# Patient Record
Sex: Female | Born: 1965 | Race: White | Hispanic: No | Marital: Married | Smoking: Never smoker
Health system: Southern US, Community
[De-identification: ages and names within clinical notes are randomized; demographics above are authoritative.]

## PROBLEM LIST (undated history)

## (undated) HISTORY — PX: LIPOMA EXCISION: SHX5283

---

## 1997-10-01 ENCOUNTER — Inpatient Hospital Stay (HOSPITAL_COMMUNITY): Admission: AD | Admit: 1997-10-01 | Discharge: 1997-10-03 | Payer: Self-pay | Admitting: *Deleted

## 1998-07-16 ENCOUNTER — Encounter: Admission: RE | Admit: 1998-07-16 | Discharge: 1998-10-14 | Payer: Self-pay | Admitting: Internal Medicine

## 1998-11-17 ENCOUNTER — Other Ambulatory Visit: Admission: RE | Admit: 1998-11-17 | Discharge: 1998-11-17 | Payer: Self-pay | Admitting: *Deleted

## 1999-08-06 ENCOUNTER — Inpatient Hospital Stay (HOSPITAL_COMMUNITY): Admission: AD | Admit: 1999-08-06 | Discharge: 1999-08-06 | Payer: Self-pay | Admitting: *Deleted

## 1999-08-06 ENCOUNTER — Inpatient Hospital Stay (HOSPITAL_COMMUNITY): Admission: AD | Admit: 1999-08-06 | Discharge: 1999-08-09 | Payer: Self-pay | Admitting: Obstetrics and Gynecology

## 1999-09-13 ENCOUNTER — Other Ambulatory Visit: Admission: RE | Admit: 1999-09-13 | Discharge: 1999-09-13 | Payer: Self-pay | Admitting: *Deleted

## 2001-02-05 ENCOUNTER — Other Ambulatory Visit: Admission: RE | Admit: 2001-02-05 | Discharge: 2001-02-05 | Payer: Self-pay | Admitting: Obstetrics and Gynecology

## 2002-08-28 ENCOUNTER — Encounter: Admission: RE | Admit: 2002-08-28 | Discharge: 2002-08-28 | Payer: Self-pay | Admitting: Neurosurgery

## 2002-08-28 ENCOUNTER — Encounter: Payer: Self-pay | Admitting: Neurosurgery

## 2002-09-11 ENCOUNTER — Encounter: Payer: Self-pay | Admitting: Internal Medicine

## 2002-09-11 ENCOUNTER — Encounter: Admission: RE | Admit: 2002-09-11 | Discharge: 2002-09-11 | Payer: Self-pay | Admitting: Internal Medicine

## 2006-10-08 ENCOUNTER — Encounter: Admission: RE | Admit: 2006-10-08 | Discharge: 2006-10-08 | Payer: Self-pay | Admitting: Internal Medicine

## 2006-10-09 ENCOUNTER — Ambulatory Visit: Payer: Self-pay | Admitting: Cardiology

## 2006-10-12 ENCOUNTER — Ambulatory Visit: Payer: Self-pay | Admitting: Cardiovascular Disease

## 2006-10-12 ENCOUNTER — Ambulatory Visit: Payer: Self-pay

## 2006-10-31 ENCOUNTER — Ambulatory Visit: Payer: Self-pay | Admitting: Cardiology

## 2009-02-05 ENCOUNTER — Ambulatory Visit: Payer: Self-pay | Admitting: Internal Medicine

## 2009-03-02 ENCOUNTER — Ambulatory Visit: Payer: Self-pay | Admitting: Internal Medicine

## 2009-03-22 ENCOUNTER — Other Ambulatory Visit: Admission: RE | Admit: 2009-03-22 | Discharge: 2009-03-22 | Payer: Self-pay | Admitting: Internal Medicine

## 2009-03-22 ENCOUNTER — Ambulatory Visit: Payer: Self-pay | Admitting: Internal Medicine

## 2010-09-06 NOTE — Assessment & Plan Note (Signed)
[facility]                            CARDIOLOGY OFFICE NOTE   [name], [name] MYCALA WARSHAWSKY                     MRN:          [id number]  DATE:10/09/2006                            DOB:          [birth year]    REFERRING PHYSICIAN:  Luanna Cole. Lenord Fellers, M.D.   REASON FOR CONSULTATION:  Chest and back pain.   HISTORY OF PRESENT ILLNESS:  Ms. Grothaus is a pleasant 45 year old woman  with recently-documented LDL cholesterol of 129 and a family history of  cardiovascular disease, including her mother who had a myocardial  infarction in her early 10's.  She reports no personal history of  cardiovascular disease, hypertension or diabetes.  She has no history of  tobacco use.  She has been in her usual state of health; however, over  the last few weeks she has experienced some symptoms initially of a  dull brief (lasting for only a few seconds), pain across her upper  chest.  She states that this seemed to be worse when she would lean  over, and was not particularly exertional in nature.  More recently in  the last few days, she has experienced more of a pressure-like sensation  and also a mid to lower back pain.  This has occurred intermittently and  not with exertion, but has been associated with a general feeling of  numbness in her arms and also some discomfort in her neck.  She states  that this morning her symptoms were worse when she was bending over  doing laundry.  She has over the last week been walking two to three  miles at a time with a friend, and is not experiencing any symptoms  while walking.  She has had no prior cardiac risk stratification  although did have a resting electrocardiogram obtained yesterday by Dr.  Lenord Fellers which was essentially normal.  She also had a standard chest x-  ray which showed a normal heart size.  No pleural fluid and clear lung  fields.  HDL was 64, LDL 129, potassium 4.4, BUN 12, creatinine 0.8.  Her hemoglobin was normal at  12.9.  She was concerned somewhat about her  heart, given her mother's medical problems and also her older sister at  age 4, who is having difficulty with elevated blood pressure and  cholesterol.  We are asked to assess her further.   ALLERGIES:  No known drug allergies.   CURRENT MEDICATIONS:  None chronically.  She does use ibuprofen and  aspirin p.r.n.   PAST MEDICAL HISTORY:  As outlined above.   PAST SURGICAL HISTORY:  1. 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  2. A right breast fibroid removed.  3. A lipoma removed from her shoulder between 1990, and 1991.   SOCIAL HISTORY:  The patient is married.  She has four children.  She is  a stay-at-home mom.  There is no tobacco use.  Denies any alcohol use.  Drinks one or two caffeinated beverages daily.   REVIEW OF SYSTEMS:  As in the history of present  illness.  She does have  some seasonal allergies.  Occasionally she feels fatigued and does not  sleep well.  She has had some trouble with anxiety and depression in the  past.  She does have some history of menstrual dysfunction and pain when  she ovulates.   PHYSICAL EXAMINATION:  VITAL SIGNS:  Blood pressure today 110/78, heart  rate 70 and regular, weight 157 pounds.  GENERAL:  A normally-nourished-appearing woman, in no acute distress.  HEENT:  Conjunctivae normal.  Pharynx clear.  NECK:  Supple, no elevated jugular venous pressure or carotid bruits.  No thyromegaly or thyroid tenderness.  LUNGS:  Clear.  Normal expansion.  No limited breathing at rest.  CARDIAC:  A regular rate and rhythm.  No S3 gallop or pericardial rub.  No loud systolic murmur.  ABDOMEN:  Without obvious hepatomegaly.  No bruits.  Bowel sounds are  present.  EXTREMITIES:  No pitting edema.  SKIN:  Warm and dry.  MUSCULOSKELETAL:  No kyphosis.  NEUROLOGIC/PSYCHIATRIC:  Alert and oriented x3.  Affect is normal.   IMPRESSION/RECOMMENDATIONS:  Recent symptoms of  chest pain and back  pain, fairly atypical in description from the perspective of ischemia.  She has had some associated arm numbness and neck pain as well, however.  Her resting electrocardiogram is normal and recent plain view of the  chest was normal.  She is not hypertensive.  She does have a family  history of cardiovascular disease and a mildly elevated low-density  lipoprotein of 129.  She remains concerned about her cardiac status.   I discussed her symptoms today and we have planned a diagnostic approach  including a contrasted CT scan of the chest, to exclude the possibility  of aortic disease or thromboembolic disease (doubt), and assuming this  is reassuring, general cardiovascular screening with an exercise  echocardiogram.  If these studies are reassuring, it may in fact be that  she is experiencing musculoskeletal discomfort, perhaps related to her  back or disk disease.  We will inform her of the test results by phone,  and I will plan a follow-up visit in the office to review these as well.  Further plans to follow.     Jonelle Sidle, MD  Electronically Signed    SGM/MedQ  DD: 10/09/2006  DT: 10/09/2006  Job #: [id number]   cc:   Luanna Cole. Lenord Fellers, M.D.

## 2010-09-06 NOTE — Assessment & Plan Note (Signed)
[facility]                            CARDIOLOGY OFFICE NOTE   NAME:[name]                     MRN:          [id number]  DATE:10/31/2006                            DOB:          [birth year]    PRIMARY CARE PHYSICIAN:  Luanna Cole. Lenord Fellers, M.D.   REASON FOR VISIT:  Follow up testing.   HISTORY OF PRESENT ILLNESS:  I saw [name] back in June.  Her history  is detailed in my previous note.  I referred her for further assessment  beginning with a CT scan of the chest which revealed no evidence of  aortic aneurysm dissection or pulmonary embolus.  Heart size and  pulmonary arterial size were described at the upper limit of normal.  Although I question whether this is of any significance clinically at  this point.  She had a exercise Cardiolite which demonstrated no  evidence of ischemia or scar and there were no abnormal  electrocardiographic changes.  I reviewed these results with the patient  today and she was reassured.  We talked about basic risk factor  modification.  She did have lipids obtained recently showing an LDL of  129 and an HDL of 64.  It is not entirely clear that she needs therapy  at this point.  Optimally an LDL under 100 would be ideal and she should  continue to work on diet and exercise.  She had some questions today  about inflammation as it relates to coronary artery disease and  atherosclerosis.  We discussed this.   ALLERGIES:  No known drug allergies.   PRESENT MEDICATIONS:  Aspirin or ibuprofen p.r.n.   EXAMINATION:  Blood pressure is 102/58, heart rate 70, weight is 160  pounds.  The patient is comfortable and in no acute distress.  No significant  change in examination as documented in the previous note.   IMPRESSION/RECOMMENDATIONS:  1. History of fairly atypical chest and back discomfort, likely      musculoskeletal.  Recent testing including CT scan of the chest and      exercise Myoview were reassuring.  I  would anticipate basic risk      factor modification strategies. Her lipids were recently outlined      and are overall fairly reassuring, although optimally an LDL under      100 would be ideal.  One could consider at some point checking a      high sensitivity C-reactive protein and if this were significantly      elevated this might lend more evidence to aggressively managing her      lipids.  Otherwise if it were in normal range, basic measures would      be reasonable.  I do not anticipate any      additional cardiac testing at this time and we can see her back as      needed.  2. Continue followup with Dr. Lenord Fellers.     Jonelle Sidle, MD  Electronically Signed    SGM/MedQ  DD: 10/31/2006  DT: 10/31/2006  Job #: [id number]   cc:  Luanna Cole. Lenord Fellers, M.D.

## 2011-02-13 ENCOUNTER — Encounter: Payer: Self-pay | Admitting: Internal Medicine

## 2011-02-14 ENCOUNTER — Encounter: Payer: Self-pay | Admitting: Internal Medicine

## 2011-02-14 ENCOUNTER — Ambulatory Visit (INDEPENDENT_AMBULATORY_CARE_PROVIDER_SITE_OTHER): Payer: PRIVATE HEALTH INSURANCE | Admitting: Internal Medicine

## 2011-02-14 VITALS — BP 116/80 | HR 72 | Temp 99.0°F | Ht 61.25 in | Wt 166.0 lb

## 2011-02-14 DIAGNOSIS — R5381 Other malaise: Secondary | ICD-10-CM

## 2011-02-14 DIAGNOSIS — H6591 Unspecified nonsuppurative otitis media, right ear: Secondary | ICD-10-CM

## 2011-02-14 DIAGNOSIS — J309 Allergic rhinitis, unspecified: Secondary | ICD-10-CM

## 2011-02-14 DIAGNOSIS — H6590 Unspecified nonsuppurative otitis media, unspecified ear: Secondary | ICD-10-CM

## 2011-02-14 DIAGNOSIS — K219 Gastro-esophageal reflux disease without esophagitis: Secondary | ICD-10-CM

## 2011-02-14 DIAGNOSIS — R5383 Other fatigue: Secondary | ICD-10-CM

## 2011-02-14 NOTE — Patient Instructions (Signed)
Take Zithromax Z-PAK as prescribed and Sterapred 6 day dosepak as prescribed. Symptoms should improve within one week. If symptoms do not improve, suggest allergy evaluation. Would restart omeprazole 40 mg at 5 PM as recommended by ENT physician

## 2011-02-14 NOTE — Progress Notes (Signed)
  Subjective:    Patient ID: [name], female    DOB: [birth year], 45 y.o.   MRN: [id number]  HPI 45 year old white female not seen since November 2010. At that time she was complaining of some trouble with hoarseness and globus sensation along with chronic right-sided otalgia. In December 2010 she was seen by Dr. Annalee Genta who found her ENT exam to be normal with the exception of evidence of reflux and placed her on omeprazole 40 mg daily at 5 PM. She had no evidence of hearing loss. Was thought to have possible TMJ dysfunction. Reflux per cautions identified to her by ENT physician. Today  is complaining of right ear pain. Patient sounds nasally congested. Not taking reflux medication at this time. Also complaining of fatigue.     Review of Systems     Objective:   Physical Exam right TM shows fullness but not redness. Left TM is only slightly full. Very boggy nasal mucosa bilaterally. Pharynx is clear without exudate. Neck is supple. Complaining of some neck tenderness but no significant enlargement of lymph nodes in neck and chest clear to auscultation. No TM  joint tenderness. No thyromegaly        Assessment & Plan:  Right serous otitis media  Probable allergic rhinitis  Fatigue  Plan: Patient treated today with Zithromax Z-PAK take 2 tablets by mouth day one followed by 1 tablet days 2 through 5. Given Sterapred DS 10 mg 6 day dosepak to take as directed in tapering course for suspected allergy symptoms, serous otitis media, boggy nasal mucosa. Patient should consider restarting omeprazole 40 mg at 5 PM as previously prescribed by ENT physician. Likely continues to have reflux. If symptoms do not improve after this course of treatment, suggest allergy evaluation. With regard to fatigue, TSH is checked today

## 2012-05-09 ENCOUNTER — Encounter: Payer: Self-pay | Admitting: Internal Medicine

## 2012-05-09 ENCOUNTER — Other Ambulatory Visit: Payer: Self-pay | Admitting: Internal Medicine

## 2012-05-09 ENCOUNTER — Ambulatory Visit (INDEPENDENT_AMBULATORY_CARE_PROVIDER_SITE_OTHER): Payer: PRIVATE HEALTH INSURANCE | Admitting: Internal Medicine

## 2012-05-09 VITALS — BP 126/84 | HR 76 | Temp 98.3°F | Ht 61.5 in | Wt 169.0 lb

## 2012-05-09 DIAGNOSIS — E785 Hyperlipidemia, unspecified: Secondary | ICD-10-CM

## 2012-05-09 DIAGNOSIS — F411 Generalized anxiety disorder: Secondary | ICD-10-CM

## 2012-05-09 DIAGNOSIS — E669 Obesity, unspecified: Secondary | ICD-10-CM

## 2012-05-09 DIAGNOSIS — M549 Dorsalgia, unspecified: Secondary | ICD-10-CM

## 2012-05-09 DIAGNOSIS — F419 Anxiety disorder, unspecified: Secondary | ICD-10-CM

## 2012-05-09 DIAGNOSIS — R109 Unspecified abdominal pain: Secondary | ICD-10-CM

## 2012-05-09 DIAGNOSIS — Z23 Encounter for immunization: Secondary | ICD-10-CM

## 2012-05-09 LAB — POCT URINALYSIS DIPSTICK
Bilirubin, UA: NEGATIVE
Ketones, UA: NEGATIVE
Leukocytes, UA: NEGATIVE
Spec Grav, UA: 1.01
pH, UA: 8

## 2012-05-09 LAB — COMPREHENSIVE METABOLIC PANEL
Albumin: 4.2 g/dL (ref 3.5–5.2)
BUN: 10 mg/dL (ref 6–23)
CO2: 27 mEq/L (ref 19–32)
Calcium: 9 mg/dL (ref 8.4–10.5)
Chloride: 105 mEq/L (ref 96–112)
Glucose, Bld: 84 mg/dL (ref 70–99)
Potassium: 4.2 mEq/L (ref 3.5–5.3)

## 2012-05-09 LAB — CBC WITH DIFFERENTIAL/PLATELET
HCT: 39.1 % (ref 36.0–46.0)
Hemoglobin: 13.1 g/dL (ref 12.0–15.0)
Lymphocytes Relative: 28 % (ref 12–46)
MCV: 88.9 fL (ref 78.0–100.0)
Monocytes Absolute: 0.3 10*3/uL (ref 0.1–1.0)
Monocytes Relative: 5 % (ref 3–12)
Neutro Abs: 3.7 10*3/uL (ref 1.7–7.7)
WBC: 5.7 10*3/uL (ref 4.0–10.5)

## 2012-05-09 LAB — LIPID PANEL: Cholesterol: 212 mg/dL — ABNORMAL HIGH (ref 0–200)

## 2012-05-09 LAB — AMYLASE: Amylase: 36 U/L (ref 0–105)

## 2012-05-09 LAB — TSH: TSH: 1.691 u[IU]/mL (ref 0.350–4.500)

## 2012-05-09 MED ORDER — TETANUS-DIPHTH-ACELL PERTUSSIS 5-2.5-18.5 LF-MCG/0.5 IM SUSP: 0.5000 mL | Freq: Once | INTRAMUSCULAR | Status: DC

## 2012-05-09 NOTE — Patient Instructions (Addendum)
We will arrange for CT of abdomen and pelvis. Fasting labs have been drawn and results are pending. We'll contact you with results. Please see GYN physician for GYN appointment and further discussion regarding abdominal pain and issues with menses and ovulation. Please have mammogram. Order has been written and given to you today.
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
## 2012-05-10 ENCOUNTER — Encounter: Payer: Self-pay | Admitting: Internal Medicine

## 2012-05-10 DIAGNOSIS — E785 Hyperlipidemia, unspecified: Secondary | ICD-10-CM | POA: Insufficient documentation

## 2012-05-10 DIAGNOSIS — E669 Obesity, unspecified: Secondary | ICD-10-CM | POA: Insufficient documentation

## 2012-05-10 LAB — H. PYLORI ANTIBODY, IGG: H Pylori IgG: 0.44 {ISR}

## 2012-05-14 ENCOUNTER — Ambulatory Visit
Admission: RE | Admit: 2012-05-14 | Discharge: 2012-05-14 | Disposition: A | Discharge status: Home / Self Care | Payer: PRIVATE HEALTH INSURANCE | Source: Ambulatory Visit | Attending: Internal Medicine | Admitting: Internal Medicine

## 2012-05-14 MED ORDER — IOHEXOL 300 MG/ML  SOLN
100.0000 mL | Freq: Once | INTRAMUSCULAR | Status: AC | PRN
  Administered 2012-05-14: 100 mL via INTRAVENOUS

## 2012-05-15 ENCOUNTER — Telehealth: Payer: Self-pay

## 2012-05-15 DIAGNOSIS — R109 Unspecified abdominal pain: Secondary | ICD-10-CM

## 2012-05-15 NOTE — Progress Notes (Signed)
Patient informed. Scheduled for abd u/s

## 2012-05-15 NOTE — Telephone Encounter (Signed)
Patient scheduled for abd ultrasound on 05/17/2012 at 9:30 at [address]. Informed of this appointment

## 2012-05-17 ENCOUNTER — Other Ambulatory Visit: Payer: Self-pay | Admitting: Internal Medicine

## 2012-05-17 ENCOUNTER — Ambulatory Visit
Admission: RE | Admit: 2012-05-17 | Discharge: 2012-05-17 | Disposition: A | Discharge status: Home / Self Care | Payer: PRIVATE HEALTH INSURANCE | Source: Ambulatory Visit | Attending: Internal Medicine | Admitting: Internal Medicine

## 2012-05-17 ENCOUNTER — Ambulatory Visit: Payer: PRIVATE HEALTH INSURANCE

## 2012-05-17 DIAGNOSIS — R109 Unspecified abdominal pain: Secondary | ICD-10-CM

## 2012-05-20 NOTE — Progress Notes (Signed)
Patient informed. 

## 2014-02-10 ENCOUNTER — Encounter: Payer: Self-pay | Admitting: Internal Medicine

## 2014-03-11 IMAGING — CT CT ABD-PELV W/ CM
2 of 5 series · 17 of 46 positions shown, 19 images · IV contrast (READICAT/WATER & [ID] OMNI 300)
Comparison: None.

CLINICAL DATA: Right upper quadrant and upper mid abdomen pain for
3 months

CT ABDOMEN AND PELVIS WITH CONTRAST
TECHNIQUE: Multidetector CT imaging of the abdomen and pelvis was
performed following the standard protocol during bolus
administration of intravenous contrast.
Contrast: 100mL OMNIPAQUE IOHEXOL 300 MG/ML  SOLN

[Series 2: abd/pelvis with · axial · 0.70mm/px · z∈[-295,+85]mm · 14 of 83 slices shown, 16 images]
[im 5/83  soft-tissue]
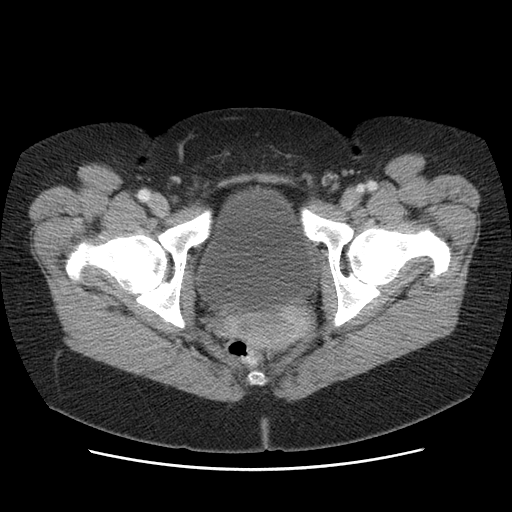
[im 5/83  bone]
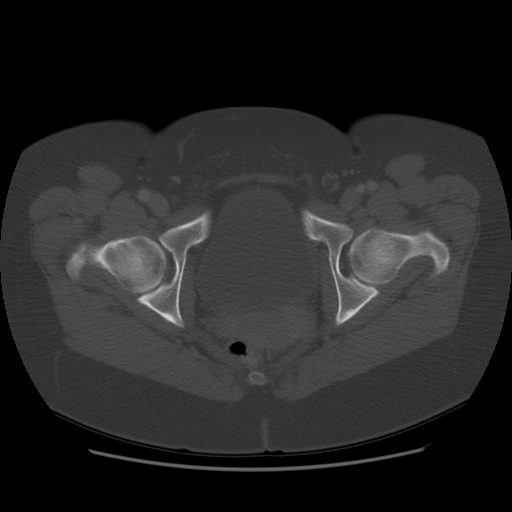
[im 13/83  soft-tissue]
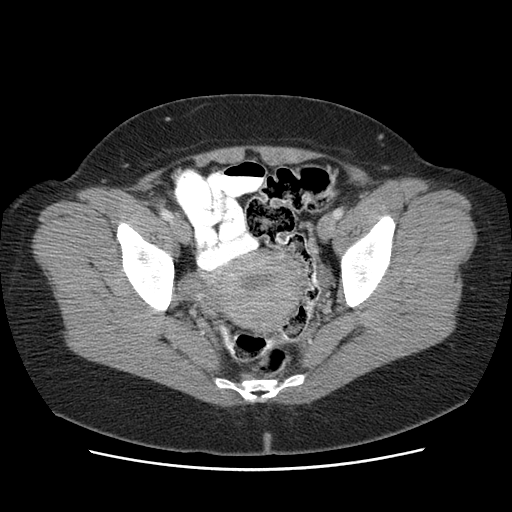
[im 17/83  soft-tissue]
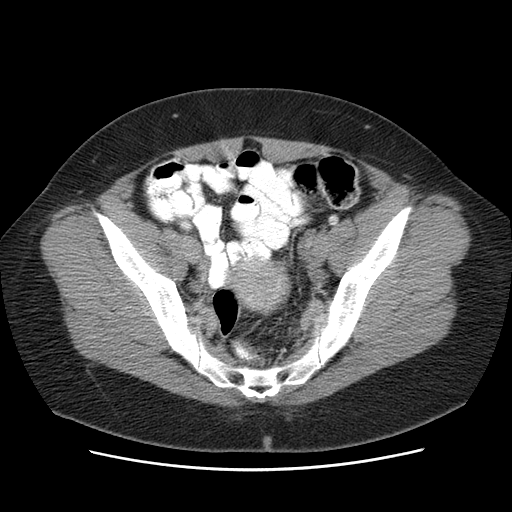
[im 21/83  soft-tissue]
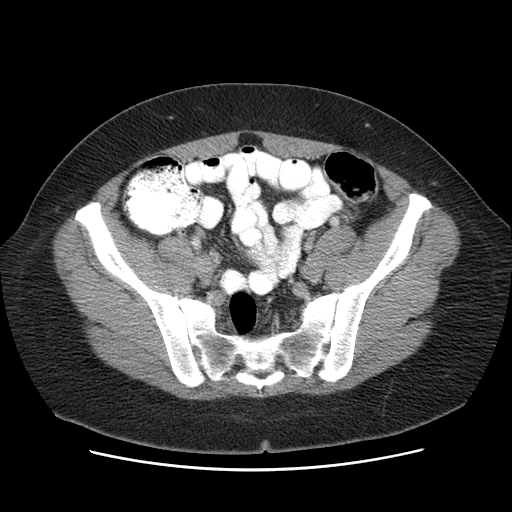
[im 29/83  soft-tissue]
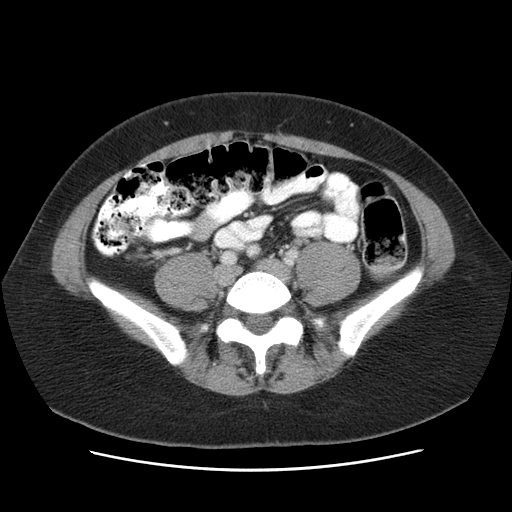
[im 33/83  soft-tissue]
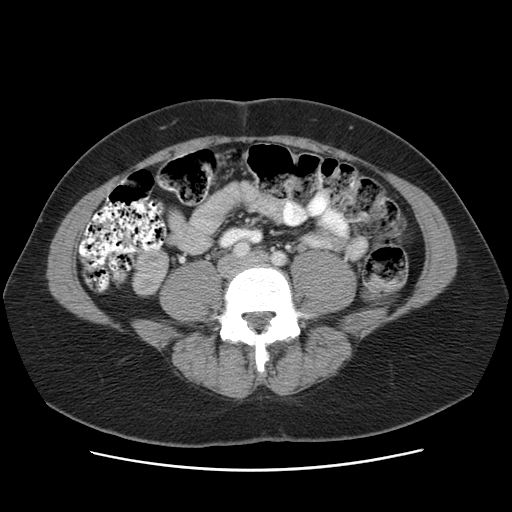
[im 37/83  soft-tissue]
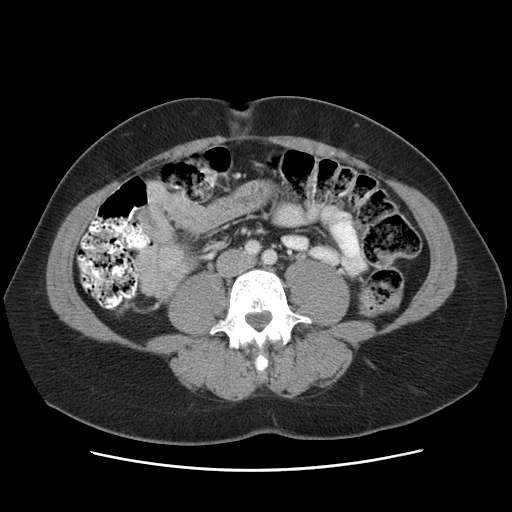
[im 46/83  soft-tissue]
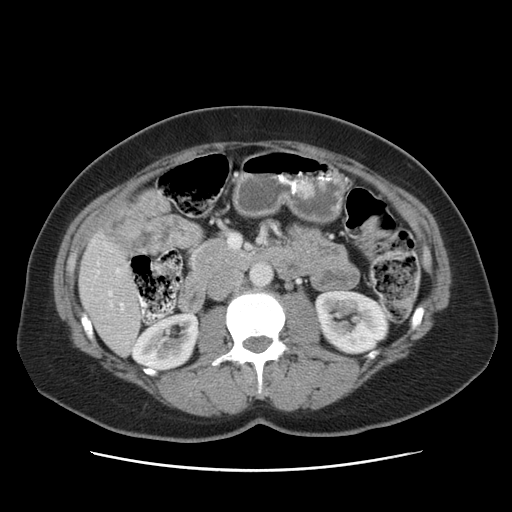
[im 50/83  soft-tissue]
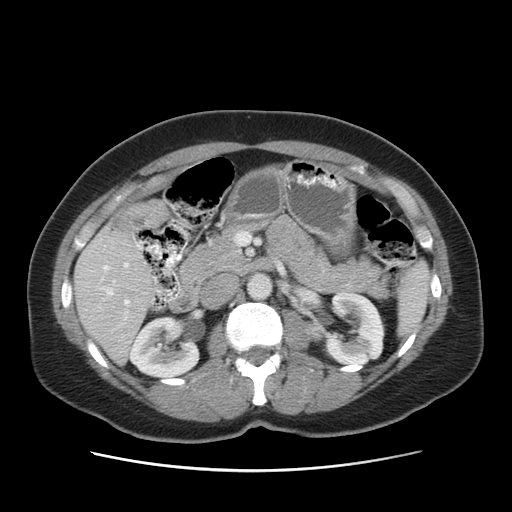
[im 50/83  bone]
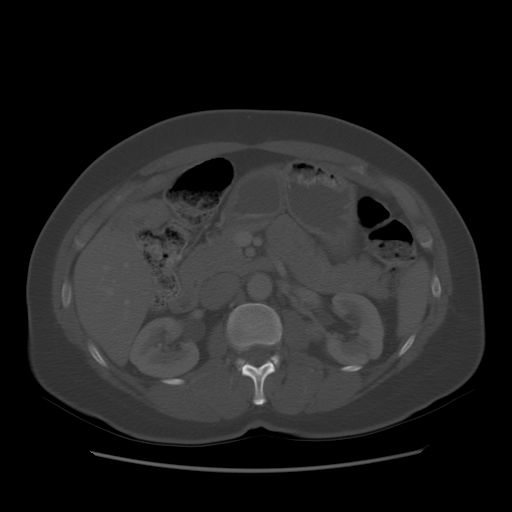
[im 54/83  soft-tissue]
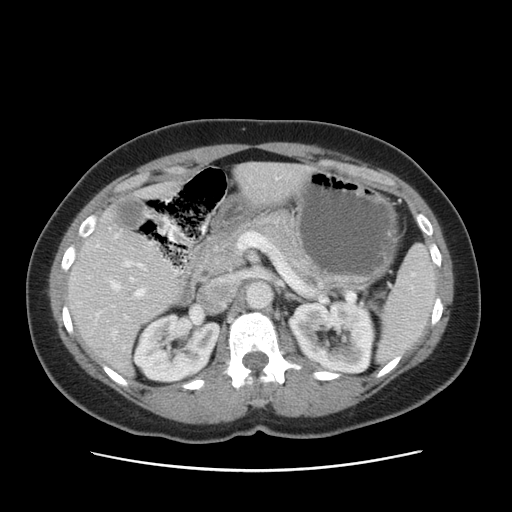
[im 62/83  soft-tissue]
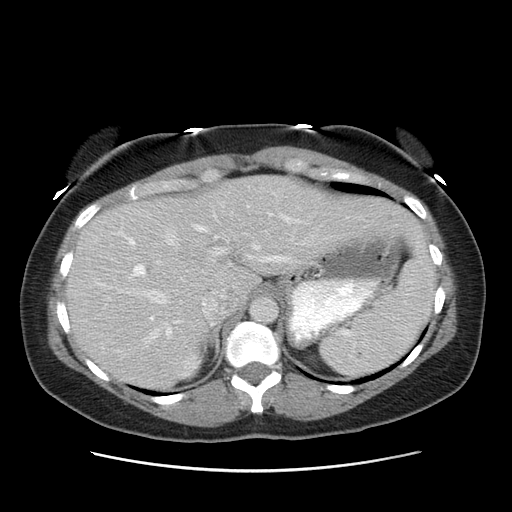
[im 66/83  soft-tissue]
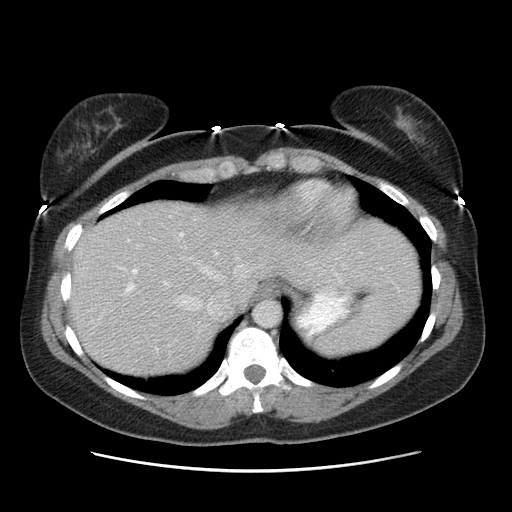
[im 70/83  soft-tissue]
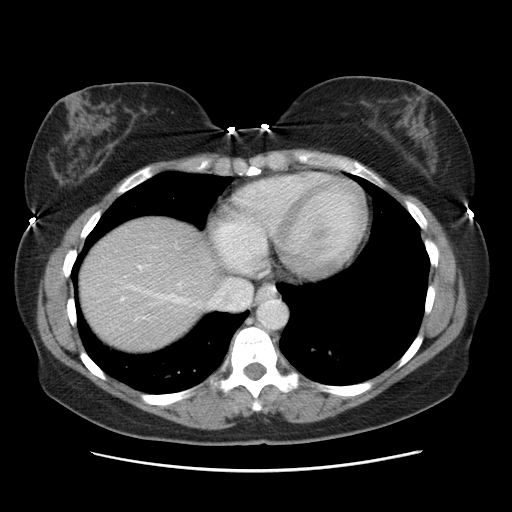
[im 78/83  soft-tissue]
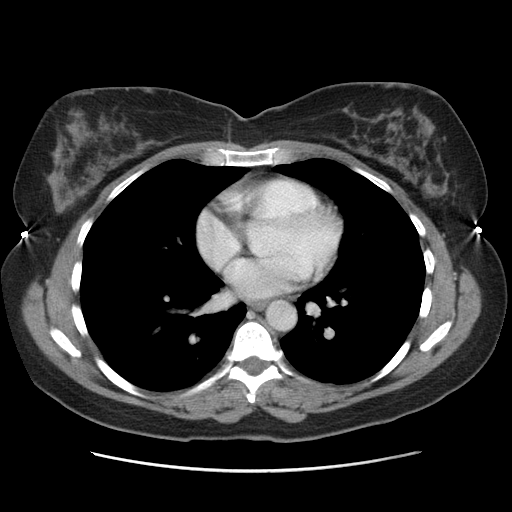

[Series 400: coronal · coronal · 0.93mm/px · 3 of 107 slices shown]
[im 36/107  soft-tissue]
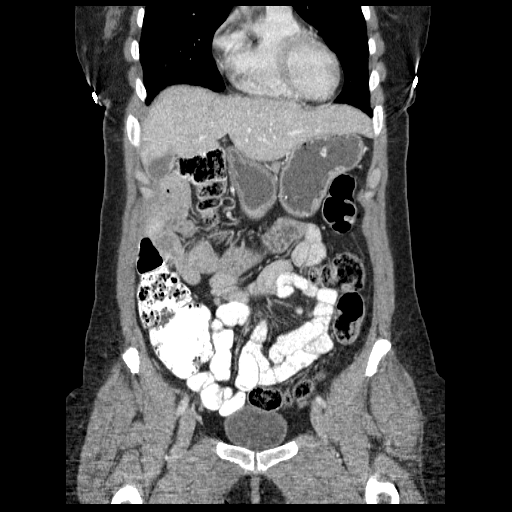
[im 48/107  soft-tissue]
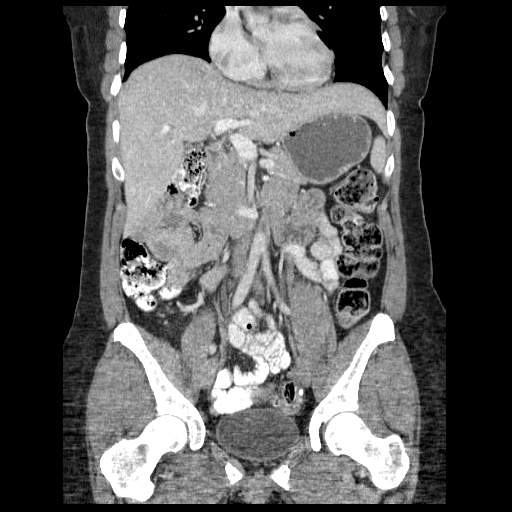
[im 59/107  soft-tissue]
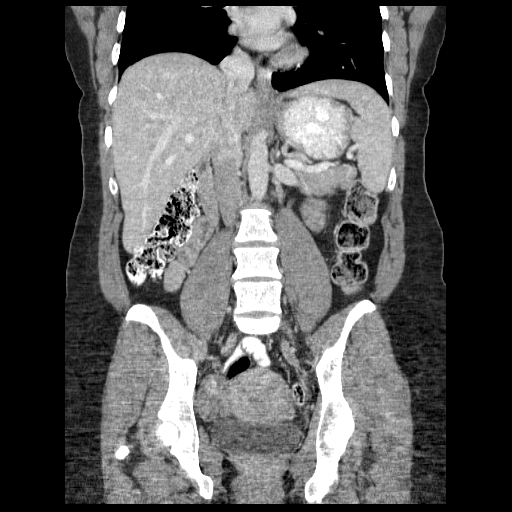

[17 of 46 positions shown; findings below may reference images not displayed]

FINDINGS: The lung bases are clear.  The liver enhances with no
focal abnormality and no ductal dilatation is seen.  No calcified
gallstones are noted within the somewhat contracted gallbladder.
The pancreas is normal in size and the pancreatic duct is not
dilated.  The adrenal glands and spleen are unremarkable.  The
stomach is moderately fluid distended with no abnormality noted.
The kidneys enhance and there is a single 2-3 mm nonobstructing
left upper pole renal calculus present.  No hydronephrosis is seen.
The abdominal aorta is normal in caliber.  No adenopathy is noted.

The uterus is normal in size and no adnexal lesion is seen.  No
free fluid is seen within the pelvis.  The urinary bladder is
moderately distended with no abnormality noted.  No abnormality of
the colon is seen.  The terminal ileum is unremarkable.  The
appendix coils in the right lower quadrant with no abnormality
noted.  No skeletal abnormality is seen.
IMPRESSION: 1.  No explanation for the patient's pain is seen.  Ultrasound of
the right upper quadrant may be help to exclude gallstones.
2.  Single 2-3 mm nonobstructing left upper pole renal calculus.

## 2015-04-08 ENCOUNTER — Emergency Department (HOSPITAL_COMMUNITY): Payer: Managed Care, Other (non HMO)

## 2015-04-08 ENCOUNTER — Emergency Department (HOSPITAL_COMMUNITY)
Admission: EM | Admit: 2015-04-08 | Discharge: 2015-04-08 | Disposition: A | Discharge status: Home / Self Care | Payer: Managed Care, Other (non HMO) | Attending: Emergency Medicine | Admitting: Emergency Medicine

## 2015-04-08 ENCOUNTER — Encounter (HOSPITAL_COMMUNITY): Payer: Self-pay | Admitting: *Deleted

## 2015-04-08 ENCOUNTER — Encounter: Payer: Self-pay | Admitting: Internal Medicine

## 2015-04-08 DIAGNOSIS — Z7982 Long term (current) use of aspirin: Secondary | ICD-10-CM | POA: Diagnosis not present

## 2015-04-08 DIAGNOSIS — R110 Nausea: Secondary | ICD-10-CM | POA: Insufficient documentation

## 2015-04-08 DIAGNOSIS — M549 Dorsalgia, unspecified: Secondary | ICD-10-CM | POA: Diagnosis not present

## 2015-04-08 DIAGNOSIS — R109 Unspecified abdominal pain: Secondary | ICD-10-CM | POA: Diagnosis present

## 2015-04-08 DIAGNOSIS — Z8679 Personal history of other diseases of the circulatory system: Secondary | ICD-10-CM | POA: Insufficient documentation

## 2015-04-08 DIAGNOSIS — R1011 Right upper quadrant pain: Secondary | ICD-10-CM | POA: Diagnosis not present

## 2015-04-08 DIAGNOSIS — Z79899 Other long term (current) drug therapy: Secondary | ICD-10-CM | POA: Diagnosis not present

## 2015-04-08 LAB — CBC WITH DIFFERENTIAL/PLATELET
BASOS ABS: 0 10*3/uL (ref 0.0–0.1)
Basophils Relative: 0 %
EOS PCT: 2 %
Eosinophils Absolute: 0.2 10*3/uL (ref 0.0–0.7)
HCT: 39.9 % (ref 36.0–46.0)
Hemoglobin: 13.4 g/dL (ref 12.0–15.0)
LYMPHS PCT: 15 %
Lymphs Abs: 1.5 10*3/uL (ref 0.7–4.0)
MCH: 30.3 pg (ref 26.0–34.0)
MCHC: 33.6 g/dL (ref 30.0–36.0)
MCV: 90.3 fL (ref 78.0–100.0)
Monocytes Absolute: 0.5 10*3/uL (ref 0.1–1.0)
Monocytes Relative: 5 %
NEUTROS ABS: 7.6 10*3/uL (ref 1.7–7.7)
NEUTROS PCT: 78 %
PLATELETS: 236 10*3/uL (ref 150–400)
RBC: 4.42 MIL/uL (ref 3.87–5.11)
RDW: 12.3 % (ref 11.5–15.5)
WBC: 9.7 10*3/uL (ref 4.0–10.5)

## 2015-04-08 LAB — URINALYSIS, ROUTINE W REFLEX MICROSCOPIC
BILIRUBIN URINE: NEGATIVE
Glucose, UA: NEGATIVE mg/dL
HGB URINE DIPSTICK: NEGATIVE
Ketones, ur: NEGATIVE mg/dL
Leukocytes, UA: NEGATIVE
Nitrite: NEGATIVE
PH: 6 (ref 5.0–8.0)
Protein, ur: NEGATIVE mg/dL
SPECIFIC GRAVITY, URINE: 1.016 (ref 1.005–1.030)

## 2015-04-08 LAB — COMPREHENSIVE METABOLIC PANEL
ALT: 20 U/L (ref 14–54)
AST: 21 U/L (ref 15–41)
Albumin: 4.4 g/dL (ref 3.5–5.0)
Alkaline Phosphatase: 59 U/L (ref 38–126)
Anion gap: 10 (ref 5–15)
BUN: 12 mg/dL (ref 6–20)
CHLORIDE: 104 mmol/L (ref 101–111)
CO2: 26 mmol/L (ref 22–32)
CREATININE: 0.75 mg/dL (ref 0.44–1.00)
Calcium: 9.3 mg/dL (ref 8.9–10.3)
GFR calc Af Amer: >60 mL/min (ref >60)
Glucose, Bld: 111 mg/dL — ABNORMAL HIGH (ref 65–99)
Potassium: 4.1 mmol/L (ref 3.5–5.1)
Sodium: 140 mmol/L (ref 135–145)
Total Bilirubin: 0.7 mg/dL (ref 0.3–1.2)
Total Protein: 7.2 g/dL (ref 6.5–8.1)

## 2015-04-08 LAB — I-STAT BETA HCG BLOOD, ED (MC, WL, AP ONLY): I-stat hCG, quantitative: <5 m[IU]/mL (ref <5)

## 2015-04-08 LAB — LIPASE, BLOOD: LIPASE: 53 U/L — AB (ref 11–51)

## 2015-04-08 MED ORDER — SODIUM CHLORIDE 0.9 % IV BOLUS (SEPSIS)
1000.0000 mL | Freq: Once | INTRAVENOUS | Status: AC
  Administered 2015-04-08: 1000 mL via INTRAVENOUS

## 2015-04-08 MED ORDER — KETOROLAC TROMETHAMINE 30 MG/ML IJ SOLN
30.0000 mg | Freq: Once | INTRAMUSCULAR | Status: AC
  Administered 2015-04-08: 30 mg via INTRAVENOUS
  Filled 2015-04-08: qty 1

## 2015-04-08 MED ORDER — ONDANSETRON HCL 4 MG/2ML IJ SOLN
4.0000 mg | Freq: Once | INTRAMUSCULAR | Status: AC
  Administered 2015-04-08: 4 mg via INTRAVENOUS
  Filled 2015-04-08: qty 2

## 2015-04-08 MED ORDER — OXYCODONE-ACETAMINOPHEN 5-325 MG PO TABS: 1.0000 | ORAL_TABLET | ORAL | Status: DC | PRN

## 2015-04-08 MED ORDER — MORPHINE SULFATE (PF) 4 MG/ML IV SOLN
4.0000 mg | Freq: Once | INTRAVENOUS | Status: AC
  Administered 2015-04-08: 4 mg via INTRAVENOUS
  Filled 2015-04-08: qty 1

## 2015-04-08 NOTE — ED Provider Notes (Signed)
CSN: [id number]     Arrival date & time 04/08/15  0355 History   First MD Initiated Contact with Patient 04/08/15 0357     Chief Complaint  Patient presents with  . Abdominal Pain     (Consider location/radiation/quality/duration/timing/severity/associated sxs/prior Treatment) HPI  Past Medical History  Diagnosis Date  . Migraine    Past Surgical History  Procedure Laterality Date  . Lipoma excision     Family History  Problem Relation Age of Onset  . Heart disease Mother   . Cancer Father    Social History  Substance Use Topics  . Smoking status: Never Smoker   . Smokeless tobacco: Never Used  . Alcohol Use: Yes     Comment: rarely   OB History    No data available     Review of Systems    Allergies  Cephalexin  Home Medications   Prior to Admission medications   Medication Sig Start Date End Date Taking? Authorizing Provider  acidophilus (RISAQUAD) CAPS capsule Take 1 capsule by mouth daily.   Yes Historical Provider, MD  aspirin 325 MG tablet Take 325 mg by mouth once.   Yes Historical Provider, MD  ibuprofen (ADVIL,MOTRIN) 200 MG tablet Take 200 mg by mouth every 6 (six) hours as needed for moderate pain.    Yes Historical Provider, MD  Multiple Vitamin (MULTIVITAMIN WITH MINERALS) TABS tablet Take 1 tablet by mouth daily.   Yes Historical Provider, MD  oxyCODONE-acetaminophen (PERCOCET/ROXICET) 5-325 MG tablet Take 1-2 tablets by mouth every 4 (four) hours as needed for severe pain. 04/08/15   Glendell Docker, NP   BP 124/86 mmHg  Pulse 63  Temp(Src) 97.1 F (36.2 C) (Oral)  Resp 16  Ht 5\' 3"  (1.6 m)  Wt 81.647 kg  BMI 31.89 kg/m2  SpO2 99%  LMP 03/28/2015 Physical Exam  ED Course  Procedures (including critical care time) Labs Review Labs Reviewed  COMPREHENSIVE METABOLIC PANEL - Abnormal; Notable for the following:    Glucose, Bld 111 (*)    All other components within normal limits  LIPASE, BLOOD - Abnormal; Notable for the following:     Lipase 53 (*)    All other components within normal limits  CBC WITH DIFFERENTIAL/PLATELET  URINALYSIS, ROUTINE W REFLEX MICROSCOPIC (NOT AT [facility])  I-STAT BETA HCG BLOOD, ED ([facility], [facility], [facility] ONLY)    Imaging Review Dg Chest 2 View  04/08/2015  CLINICAL DATA:  Chronic intermittent abdominal pain. Pain radiates to the back. Nausea. Initial encounter. EXAM: CHEST  2 VIEW COMPARISON:  Chest radiograph performed 10/08/2006, and CTA of the chest performed 10/12/2006 FINDINGS: The lungs are well-aerated and clear. There is no evidence of focal opacification, pleural effusion or pneumothorax. The heart is normal in size; the mediastinal contour is within normal limits. No acute osseous abnormalities are seen. IMPRESSION: No acute cardiopulmonary process seen. Electronically Signed   By: Garald Balding M.D.   On: 04/08/2015 07:01   US Abdomen Complete  04/08/2015  CLINICAL DATA:  Chronic worsening generalized abdominal pain and back pain. Nausea. Initial encounter. EXAM: ULTRASOUND ABDOMEN COMPLETE COMPARISON:  Abdominal ultrasound performed 05/17/2012, and CT of the abdomen and pelvis performed 05/14/2012 FINDINGS: Gallbladder: No gallstones or wall thickening visualized. No sonographic Murphy sign noted. Difficult to fully assess due to surrounding bowel gas. Common bile duct: Diameter: 0.4 cm, within normal limits in caliber. Liver: No focal lesion identified. Within normal limits in parenchymal echogenicity. IVC: No abnormality visualized. Pancreas: Visualized portion unremarkable. Spleen: Size  and appearance within normal limits. Right Kidney: Length: 10.9 cm. Echogenicity within normal limits. A small right-sided extrarenal pelvis is noted. No mass or hydronephrosis visualized. Left Kidney: Length: 11.3 cm. Echogenicity within normal limits. No mass or hydronephrosis visualized. Abdominal aorta: No aneurysm visualized. Other findings: None. IMPRESSION: Unremarkable abdominal ultrasound. Electronically  Signed   By: Garald Balding M.D.   On: 04/08/2015 06:20   I have personally reviewed and evaluated these images and lab results as part of my medical decision-making.   EKG Interpretation None      MDM   Final diagnoses:  Right upper quadrant pain    Pt is not pain free but feeling some what better. Will send home with oxycodone. Discussed follow up with Naomie Dean, NP 04/08/15 [id number]  Rolland Porter, MD 04/09/15 [id number]

## 2015-04-08 NOTE — ED Notes (Signed)
Patient transported to X-ray 

## 2015-04-08 NOTE — Discharge Instructions (Signed)

## 2015-04-08 NOTE — ED Provider Notes (Signed)
CSN: [id number]     Arrival date & time 04/08/15  0355 History   First MD Initiated Contact with Patient 04/08/15 0357     Chief Complaint  Patient presents with  . Abdominal Pain     (Consider location/radiation/quality/duration/timing/severity/associated sxs/prior Treatment) HPI Comments: 49 year old female with no significant past medical history presents to the emergency department for evaluation of abdominal pain. Patient states that she has had intermittent pain in her right mid back over the past few months. She noted the pain mostly in the morning and states that the pain would relieve itself without intervention as the day progressed. Since 2200 last night, patient reports intense sharp, "stabbing and twisting", pain in her right upper quadrant which radiates through to her back. Patient reports some mild nausea. She has had no emesis. She reports that she cannot take comfortable because of the pain. Patient had a glass of wine as well as steak tips for dinner. She denies ingesting anything greasy or fatty in nature. No associated fever, chest pain, shortness of breath, or urinary symptoms. She denies a history of abdominal surgeries.  Patient is a 49 y.o. female presenting with abdominal pain. The history is provided by the patient. No language interpreter was used.  Abdominal Pain Associated symptoms: nausea   Associated symptoms: no chest pain, no dysuria, no fever, no hematuria, no shortness of breath and no vomiting     Past Medical History  Diagnosis Date  . Migraine    Past Surgical History  Procedure Laterality Date  . Lipoma excision     Family History  Problem Relation Age of Onset  . Heart disease Mother   . Cancer Father    Social History  Substance Use Topics  . Smoking status: Never Smoker   . Smokeless tobacco: Never Used  . Alcohol Use: Yes     Comment: rarely   OB History    No data available      Review of Systems  Constitutional: Negative for  fever.  Respiratory: Negative for shortness of breath.   Cardiovascular: Negative for chest pain.  Gastrointestinal: Positive for nausea and abdominal pain. Negative for vomiting.  Genitourinary: Negative for dysuria and hematuria.  Musculoskeletal: Positive for back pain.  All other systems reviewed and are negative.   Allergies  Cephalexin  Home Medications   Prior to Admission medications   Medication Sig Start Date End Date Taking? Authorizing Provider  acidophilus (RISAQUAD) CAPS capsule Take 1 capsule by mouth daily.   Yes Historical Provider, MD  aspirin 325 MG tablet Take 325 mg by mouth once.   Yes Historical Provider, MD  ibuprofen (ADVIL,MOTRIN) 200 MG tablet Take 200 mg by mouth every 6 (six) hours as needed for moderate pain.    Yes Historical Provider, MD  Multiple Vitamin (MULTIVITAMIN WITH MINERALS) TABS tablet Take 1 tablet by mouth daily.   Yes Historical Provider, MD   BP 124/86 mmHg  Pulse 63  Temp(Src) 97.1 F (36.2 C) (Oral)  Resp 16  Ht 5\' 3"  (1.6 m)  Wt 81.647 kg  BMI 31.89 kg/m2  SpO2 99%  LMP 03/28/2015   Physical Exam  Constitutional: She is oriented to person, place, and time. She appears well-developed and well-nourished. No distress.  Nontoxic/nonseptic appearing  HENT:  Head: Normocephalic and atraumatic.  Eyes: Conjunctivae and EOM are normal. No scleral icterus.  Neck: Normal range of motion.  Cardiovascular: Normal rate, regular rhythm and intact distal pulses.   Pulmonary/Chest: Effort normal. No respiratory distress.  She has no wheezes. She has no rales.  Lungs CTAB. Respirations even and unlabored.  Abdominal: Soft. She exhibits no distension. There is tenderness. There is no rebound and no guarding.  RUQ TTP without a positive Murphy's sign. Abdomen soft. No masses or peritoneal signs.  Musculoskeletal: Normal range of motion.  Neurological: She is alert and oriented to person, place, and time. She exhibits normal muscle tone.  Coordination normal.  Skin: Skin is warm and dry. No rash noted. She is not diaphoretic. No erythema. No pallor.  Psychiatric: She has a normal mood and affect. Her behavior is normal.  Nursing note and vitals reviewed.   ED Course  Procedures (including critical care time) Labs Review Labs Reviewed  COMPREHENSIVE METABOLIC PANEL - Abnormal; Notable for the following:    Glucose, Bld 111 (*)    All other components within normal limits  LIPASE, BLOOD - Abnormal; Notable for the following:    Lipase 53 (*)    All other components within normal limits  CBC WITH DIFFERENTIAL/PLATELET  URINALYSIS, ROUTINE W REFLEX MICROSCOPIC (NOT AT [facility])  I-STAT BETA HCG BLOOD, ED ([facility], [facility], [facility] ONLY)    Imaging Review US Abdomen Complete  04/08/2015  CLINICAL DATA:  Chronic worsening generalized abdominal pain and back pain. Nausea. Initial encounter. EXAM: ULTRASOUND ABDOMEN COMPLETE COMPARISON:  Abdominal ultrasound performed 05/17/2012, and CT of the abdomen and pelvis performed 05/14/2012 FINDINGS: Gallbladder: No gallstones or wall thickening visualized. No sonographic Murphy sign noted. Difficult to fully assess due to surrounding bowel gas. Common bile duct: Diameter: 0.4 cm, within normal limits in caliber. Liver: No focal lesion identified. Within normal limits in parenchymal echogenicity. IVC: No abnormality visualized. Pancreas: Visualized portion unremarkable. Spleen: Size and appearance within normal limits. Right Kidney: Length: 10.9 cm. Echogenicity within normal limits. A small right-sided extrarenal pelvis is noted. No mass or hydronephrosis visualized. Left Kidney: Length: 11.3 cm. Echogenicity within normal limits. No mass or hydronephrosis visualized. Abdominal aorta: No aneurysm visualized. Other findings: None. IMPRESSION: Unremarkable abdominal ultrasound. Electronically Signed   By: Garald Balding M.D.   On: 04/08/2015 06:20     I have personally reviewed and evaluated these images and  lab results as part of my medical decision-making.   EKG Interpretation None      0625 - Low suspicion for PE as patient has no tachycardia, tachypnea, or hypoxia. No recent surgeries or hospitalizations. PERC negative. MDM   Final diagnoses:  Right upper quadrant pain    49 year old female presents to the emergency department for evaluation of intermittent right upper quadrant abdominal pain which has been preceded by sporadic right flank pain. Gallbladder etiology initially suspected; however, patient has a noncontributory laboratory workup and a negative ultrasound. Will add a chest x-ray and urinalysis. Patient signed out to Glendell Docker, NP at shift change who will follow up on work up and disposition appropriately.   Filed Vitals:   04/08/15 0416 04/08/15 0611  BP: 176/95 124/86  Pulse: 81 63  Temp: 97.7 F (36.5 C) 97.1 F (36.2 C)  TempSrc: Oral Oral  Resp: 20 16  Height: 5\' 3"  (1.6 m)   Weight: 81.647 kg   SpO2: 96% 99%     Antonietta Breach, PA-C 04/08/15 [id number]  Rolland Porter, MD 04/08/15 [phone]

## 2015-04-08 NOTE — ED Notes (Signed)
Pt states that she has had intermittent abd pain over the last couple of months; pt states that the pain would be worse after she ate and in the morning; pt states that she began to have intense abd pain around 2200 last pm; pt states that the pain radiates through to her back and is like being stabbed; pt states that she is unable to get comfortable; pt c/o nausea with no vomiting

## 2015-04-08 NOTE — ED Notes (Signed)
MD at bedside. 

## 2015-04-08 NOTE — ED Notes (Signed)
Ultrasound at bedside

## 2015-04-09 ENCOUNTER — Emergency Department (HOSPITAL_COMMUNITY)
Admission: EM | Admit: 2015-04-09 | Discharge: 2015-04-09 | Disposition: A | Discharge status: Home / Self Care | Payer: 59 | Attending: Emergency Medicine | Admitting: Emergency Medicine

## 2015-04-09 ENCOUNTER — Emergency Department (HOSPITAL_COMMUNITY): Payer: 59

## 2015-04-09 ENCOUNTER — Encounter (HOSPITAL_COMMUNITY): Payer: Self-pay

## 2015-04-09 DIAGNOSIS — R1011 Right upper quadrant pain: Secondary | ICD-10-CM | POA: Diagnosis not present

## 2015-04-09 DIAGNOSIS — Z79899 Other long term (current) drug therapy: Secondary | ICD-10-CM | POA: Insufficient documentation

## 2015-04-09 DIAGNOSIS — Z8679 Personal history of other diseases of the circulatory system: Secondary | ICD-10-CM | POA: Insufficient documentation

## 2015-04-09 DIAGNOSIS — R109 Unspecified abdominal pain: Secondary | ICD-10-CM

## 2015-04-09 MED ORDER — IOHEXOL 300 MG/ML  SOLN
50.0000 mL | Freq: Once | INTRAMUSCULAR | Status: AC | PRN
  Administered 2015-04-09: 50 mL via INTRAVENOUS

## 2015-04-09 MED ORDER — MORPHINE SULFATE (PF) 4 MG/ML IV SOLN
4.0000 mg | Freq: Once | INTRAVENOUS | Status: AC
  Administered 2015-04-09: 4 mg via INTRAVENOUS
  Filled 2015-04-09: qty 1

## 2015-04-09 MED ORDER — IOHEXOL 300 MG/ML  SOLN
100.0000 mL | Freq: Once | INTRAMUSCULAR | Status: AC | PRN
  Administered 2015-04-09: 100 mL via INTRAVENOUS

## 2015-04-09 MED ORDER — CYCLOBENZAPRINE HCL 10 MG PO TABS: 10.0000 mg | ORAL_TABLET | Freq: Two times a day (BID) | ORAL | Status: DC | PRN

## 2015-04-09 NOTE — Discharge Instructions (Signed)
Abdominal Pain, Adult Many things can cause abdominal pain. Usually, abdominal pain is not caused by a disease and will improve without treatment. It can often be observed and treated at home. Your health care provider will do a physical exam and possibly order blood tests and X-rays to help determine the seriousness of your pain. However, in many cases, more time must pass before a clear cause of the pain can be found. Before that point, your health care provider may not know if you need more testing or further treatment. HOME CARE INSTRUCTIONS Monitor your abdominal pain for any changes. The following actions may help to alleviate any discomfort you are experiencing:  Only take over-the-counter or prescription medicines as directed by your health care provider.  Do not take laxatives unless directed to do so by your health care provider.  Try a clear liquid diet (broth, tea, or water) as directed by your health care provider. Slowly move to a bland diet as tolerated. SEEK MEDICAL CARE IF:  You have unexplained abdominal pain.  You have abdominal pain associated with nausea or diarrhea.  You have pain when you urinate or have a bowel movement.  You experience abdominal pain that wakes you in the night.  You have abdominal pain that is worsened or improved by eating food.  You have abdominal pain that is worsened with eating fatty foods.  You have a fever. SEEK IMMEDIATE MEDICAL CARE IF:  Your pain does not go away within 2 hours.  You keep throwing up (vomiting).  Your pain is felt only in portions of the abdomen, such as the right side or the left lower portion of the abdomen.  You pass bloody or black tarry stools. MAKE SURE YOU:  Understand these instructions.  Will watch your condition.  Will get help right away if you are not doing well or get worse.   This information is not intended to replace advice given to you by your health care provider. Make sure you discuss  any questions you have with your health care provider.   Document Released: 01/18/2005 Document Revised: 12/30/2014 Document Reviewed: 12/18/2012 Elsevier Interactive Patient Education 2016 [place].  Biliary Colic Biliary colic is a pain in the upper abdomen. The pain:  Is usually felt on the right side of the abdomen, but it may also be felt in the center of the abdomen, just below the breastbone (sternum).  May spread back toward the right shoulder blade.  May be steady or irregular.  May be accompanied by nausea and vomiting. Most of the time, the pain goes away in 1-5 hours. After the most intense pain passes, the abdomen may continue to ache mildly for about 24 hours. Biliary colic is caused by a blockage in the bile duct. The bile duct is a pathway that carries bile--a liquid that helps to digest fats--from the gallbladder to the small intestine. Biliary colic usually occurs after eating, when the digestive system demands bile. The pain develops when muscle cells contract forcefully to try to move the blockage so that bile can get by. HOME CARE INSTRUCTIONS  Take medicines only as directed by your health care provider.  Drink enough fluid to keep your urine clear or pale yellow.  Avoid fatty, greasy, and fried foods. These kinds of foods increase your body's demand for bile.  Avoid any foods that make your pain worse.  Avoid overeating.  Avoid having a large meal after fasting. SEEK MEDICAL CARE IF:  You develop a fever.  Your pain gets worse.  You vomit.  You develop nausea that prevents you from eating and drinking. SEEK IMMEDIATE MEDICAL CARE IF:  You suddenly develop a fever and shaking chills.  You develop a yellowish discoloration (jaundice) of:  Skin.  Whites of the eyes.  Mucous membranes.  You have continuous or severe pain that is not relieved with medicines.  You have nausea and vomiting that is not relieved with medicines.  You develop  dizziness or you faint.   This information is not intended to replace advice given to you by your health care provider. Make sure you discuss any questions you have with your health care provider.   Document Released: 09/11/2005 Document Revised: 08/25/2014 Document Reviewed: 01/20/2014 Elsevier Interactive Patient Education 2016 [employer]  HIDA (Hepatobiliary) Scan A hepatobiliary scan, also known as a HIDA scan, is a type of imaging test that evaluates the function of your liver, gallbladder, and their ducts. These parts make up your hepatobiliary system. You may need this scan if you have symptoms of liver or gallbladder disease.  A HIDA scan uses a special camera that detects radioactive energy (gamma rays). For this exam, you will be given a radioactive substance, called a radiotracer, through an IV tube inserted in your hand or arm. As the radiotracer moves through your hepatobiliary system, the camera and a computer detect the gamma rays and form them into images that show how well your system is working. LET [facility] CARE PROVIDER KNOW ABOUT:  Any possibility of pregnancy or if you are breastfeeding.  Any allergies you have.  All medicines you are taking, including vitamins, herbs, eye drops, creams, and over-the-counter medicines.  Previous problems you or members of your family have had with the use of anesthetics.  Any blood disorders you have.  Previous surgeries you have had.  Medical conditions you have. BEFORE THE PROCEDURE  Take medicines only as directed by your health care provider.  Your health care provider will let you know when you should start fasting. You may not be able to eat or drink anything after midnight on the night before the procedure or for at least four hours before the test.  Do not wear jewelry to the exam.  Wear loose, comfortable clothing. You may be asked to put on a gown for the procedure. PROCEDURE   An IV tube will be inserted  into a vein in your hand or arm. It will remain in place throughout the exam.  A small amount of the radiotracer material will be injected through your IV tube.  You may feel a cold sensation as the material runs through your IV tube.  While you are lying down, a technician will place the gamma camera over your abdomen.  The camera may rotate around your body. You may be asked to stay still or move into a certain position.  You will then be given a medicine called cholecystokinin (CCK) through your IV tube. This will make your gallbladder empty. You may feel nauseous or have cramping for a short time.  Additional images will be taken after CCK is given.  After all the images have been taken, your IV tube will be removed. AFTER THE PROCEDURE  You may resume normal activities and diet as directed by your health care provider.  The radiotracer will leave your body over the next few days. There are no long-term side effects from the radiotracer. Drink lots of fluids to help flush it out of your body.  A health care provider who specializes in nuclear medicine will read the scan and send a report to your regular health care provider.   This information is not intended to replace advice given to you by your health care provider. Make sure you discuss any questions you have with your health care provider.   Document Released: 04/07/2000 Document Revised: 05/01/2014 Document Reviewed: 08/13/2013 Elsevier Interactive Patient Education 2016 [employer]  Pain Without a Known Cause WHAT IS PAIN WITHOUT A KNOWN CAUSE? Pain can occur in any part of the body and can range from mild to severe. Sometimes no cause can be found for why you are having pain. Some types of pain that can occur without a known cause include:   Headache.  Back pain.  Abdominal pain.  Neck pain. HOW IS PAIN WITHOUT A KNOWN CAUSE DIAGNOSED?  Your health care provider will try to find the cause of your pain. This may  include:  Physical exam.  Medical history.  Blood tests.  Urine tests.  X-rays. If no cause is found, your health care provider may diagnose you with pain without a known cause.  IS THERE TREATMENT FOR PAIN WITHOUT A CAUSE?  Treatment depends on the kind of pain you have. Your health care provider may prescribe medicines to help relieve your pain.  WHAT CAN I DO AT HOME FOR MY PAIN?   Take medicines only as directed by your health care provider.  Stop any activities that cause pain. During periods of severe pain, bed rest may help.  Try to reduce your stress with activities such as yoga or meditation. Talk to your health care provider for other stress-reducing activity recommendations.  Exercise regularly, if approved by your health care provider.  Eat a healthy diet that includes fruits and vegetables. This may improve pain. Talk to your health care provider if you have any questions about your diet. WHAT IF MY PAIN DOES NOT GET BETTER?  If you have a painful condition and no reason can be found for the pain or the pain gets worse, it is important to follow up with your health care provider. It may be necessary to repeat tests and look further for a possible cause.    This information is not intended to replace advice given to you by your health care provider. Make sure you discuss any questions you have with your health care provider.  Follow up with your primary care provider as soon as possible for re-evaluation. Return to the ED if you experience fever, chills, vomiting, blood in stool or urine.

## 2015-04-09 NOTE — ED Provider Notes (Signed)
CSN: [id number]     Arrival date & time 04/09/15  1626 History   First MD Initiated Contact with Patient 04/09/15 1733     Chief Complaint  Patient presents with  . Back Pain     (Consider location/radiation/quality/duration/timing/severity/associated sxs/prior Treatment) HPI   [name] is a 49 y.o F with no significant past medical history who presents to the emergency department today complaining of right upper quadrant abdominal pain and right-sided flank pain. Patient was seen and evaluated in the emergency department yesterday for the same symptoms. Patient states that over the last 3 months she has woken up every morning with a dull right-sided flank pain that eventually wears off throughout the day. This pain is worsened with movement and improved with ibuprofen. Patient states that 2 nights ago around 10 PM patient was walking around when she felt sudden onset sharp right upper quadrant abdominal pain that has been constant since that time. Patient denies eating fatty foods prior to pain onset. No alcohol consumption. Patient was given Percocet in the ED yesterday and has not been able to have BM since her visit here. Denies fever, chills, nausea, vomiting, diarrhea, melena, hematochezia, chest pain, shortness of breath, dysuria.  Past Medical History  Diagnosis Date  . Migraine    Past Surgical History  Procedure Laterality Date  . Lipoma excision     Family History  Problem Relation Age of Onset  . Heart disease Mother   . Cancer Father    Social History  Substance Use Topics  . Smoking status: Never Smoker   . Smokeless tobacco: Never Used  . Alcohol Use: Yes     Comment: rarely   OB History    No data available     Review of Systems  All other systems reviewed and are negative.     Allergies  Cephalexin  Home Medications   Prior to Admission medications   Medication Sig Start Date End Date Taking? Authorizing Provider  acetaminophen (TYLENOL) 500  MG tablet Take 1,000 mg by mouth every 6 (six) hours as needed for moderate pain.   Yes Historical Provider, MD  docusate sodium (COLACE) 100 MG capsule Take 100 mg by mouth daily as needed for mild constipation or moderate constipation.   Yes Historical Provider, MD  ibuprofen (ADVIL,MOTRIN) 200 MG tablet Take 200 mg by mouth every 6 (six) hours as needed for moderate pain.    Yes Historical Provider, MD  Multiple Vitamin (MULTIVITAMIN WITH MINERALS) TABS tablet Take 1 tablet by mouth daily.   Yes Historical Provider, MD  naproxen sodium (ANAPROX) 220 MG tablet Take 440 mg by mouth 2 (two) times daily as needed (pain).   Yes Historical Provider, MD  oxyCODONE-acetaminophen (PERCOCET/ROXICET) 5-325 MG tablet Take 1-2 tablets by mouth every 4 (four) hours as needed for severe pain. 04/08/15  Yes Glendell Docker, NP   BP 149/87 mmHg  Pulse 82  Temp(Src) 98.7 F (37.1 C) (Oral)  Resp 18  SpO2 100%  LMP 03/28/2015 Physical Exam  Constitutional: She is oriented to person, place, and time. She appears well-developed and well-nourished. No distress.  Non-toxic Non-septic appearing  HENT:  Head: Normocephalic and atraumatic.  Mouth/Throat: No oropharyngeal exudate.  Eyes: Conjunctivae and EOM are normal. Pupils are equal, round, and reactive to light. Right eye exhibits no discharge. Left eye exhibits no discharge. No scleral icterus.  Cardiovascular: Normal rate, regular rhythm, normal heart sounds and intact distal pulses.  Exam reveals no gallop and no friction rub.  No murmur heard. Pulmonary/Chest: Effort normal and breath sounds normal. No respiratory distress. She has no wheezes. She has no rales. She exhibits no tenderness.  Abdominal: Soft. She exhibits no distension. There is tenderness ( RUQ TTP). There is no guarding.  Positive Murphy's sign. No CVA tenderness. No peritoneal signs.   Musculoskeletal: Normal range of motion. She exhibits no edema.  No midline spinal tenderness.   Neurological: She is alert and oriented to person, place, and time. No cranial nerve deficit.  Strength 5/5 throughout. No sensory deficits.    Skin: Skin is warm and dry. No rash noted. She is not diaphoretic. No erythema. No pallor.  Psychiatric: She has a normal mood and affect. Her behavior is normal.  Nursing note and vitals reviewed.   ED Course  Procedures (including critical care time) Labs Review Labs Reviewed - No data to display  Imaging Review Dg Chest 2 View  04/08/2015  CLINICAL DATA:  Chronic intermittent abdominal pain. Pain radiates to the back. Nausea. Initial encounter. EXAM: CHEST  2 VIEW COMPARISON:  Chest radiograph performed 10/08/2006, and CTA of the chest performed 10/12/2006 FINDINGS: The lungs are well-aerated and clear. There is no evidence of focal opacification, pleural effusion or pneumothorax. The heart is normal in size; the mediastinal contour is within normal limits. No acute osseous abnormalities are seen. IMPRESSION: No acute cardiopulmonary process seen. Electronically Signed   By: Garald Balding M.D.   On: 04/08/2015 07:01   US Abdomen Complete  04/08/2015  CLINICAL DATA:  Chronic worsening generalized abdominal pain and back pain. Nausea. Initial encounter. EXAM: ULTRASOUND ABDOMEN COMPLETE COMPARISON:  Abdominal ultrasound performed 05/17/2012, and CT of the abdomen and pelvis performed 05/14/2012 FINDINGS: Gallbladder: No gallstones or wall thickening visualized. No sonographic Murphy sign noted. Difficult to fully assess due to surrounding bowel gas. Common bile duct: Diameter: 0.4 cm, within normal limits in caliber. Liver: No focal lesion identified. Within normal limits in parenchymal echogenicity. IVC: No abnormality visualized. Pancreas: Visualized portion unremarkable. Spleen: Size and appearance within normal limits. Right Kidney: Length: 10.9 cm. Echogenicity within normal limits. A small right-sided extrarenal pelvis is noted. No mass or  hydronephrosis visualized. Left Kidney: Length: 11.3 cm. Echogenicity within normal limits. No mass or hydronephrosis visualized. Abdominal aorta: No aneurysm visualized. Other findings: None. IMPRESSION: Unremarkable abdominal ultrasound. Electronically Signed   By: Garald Balding M.D.   On: 04/08/2015 06:20   I have personally reviewed and evaluated these images and lab results as part of my medical decision-making.   EKG Interpretation None      MDM   Final diagnoses:  Right upper quadrant pain    Pt with persistent RUQ abdominal pain unchanged from yesterday. Pt seen an evaluated in ED yesterday for same symptoms. All lab work at that time was wnl. Negative CXR. Normal RUQ [place]. Pt afebrile in ED, all VSS. Pt appears well. Will not repeat lab work as symptoms are unchanged and labs were performed yesterday. Pt requesting CT scan. Discussed with pt that we can perform the CT scan today however, if scan is unremarkable pt will be d/c home with GI follow up. No peritoneal signs. No abdominal rigidity present.   Patient is nontoxic, nonseptic appearing, in no apparent distress.  Patient's pain and other symptoms adequately managed in emergency department. Imaging reviewed. CT negative for acute abnormality.  Patient does not meet the SIRS or Sepsis criteria.  On repeat exam patient does not have a surgical abdomin and there are no peritoneal  signs.  No indication of appendicitis, bowel obstruction, bowel perforation, cholecystitis, diverticulitis, PID, or ectopic pregnancy.  Patient discharged home with symptomatic treatment and given strict instructions for follow-up with their primary care physician/GI.  I have also discussed reasons to return immediately to the ER.  Patient expresses understanding and agrees with plan.       Dondra Spry [place], PA-C 04/10/15 0150  Daleen Bo, MD 04/10/15 [phone]

## 2015-04-09 NOTE — ED Notes (Signed)
Patient c/o right sided back pain.  Patient states was seen at [facility] Wednesday for the same after being seen here tried to make an appointment with PCP and unable to see her until February.  Patient states that has been taken Percocet that was prescribed for pain without relief and made her nauseas and gave her HA's.  Stated that has been taking Ibuprofen today instead.  Denies N/V/D and denies fever. Patient states "no BM in 2 days".  Patient states that pain 8/10 breathing even and unlabored, NAD at this time.

## 2015-06-11 ENCOUNTER — Ambulatory Visit: Payer: PRIVATE HEALTH INSURANCE | Admitting: Internal Medicine

## 2017-08-01 ENCOUNTER — Telehealth: Payer: Self-pay

## 2017-08-01 NOTE — Telephone Encounter (Signed)
Pt called will come at 4 pm tomorrow.

## 2017-08-01 NOTE — Telephone Encounter (Signed)
Patient called she was last here in 2014. She would like to come see you she thinks she's having thyroid issues she said she noticed a "bulge in the middle of her neck" for about 6-8 months she is asymptomatic. She said she has some friends that had the same thing going on and it resulted to be thyroid cancer. She said she is concerned and would like to know what to do?

## 2017-08-01 NOTE — Telephone Encounter (Signed)
SCHEDULED

## 2017-08-01 NOTE — Telephone Encounter (Signed)
Call back number [phone]

## 2017-08-02 ENCOUNTER — Encounter: Payer: Self-pay | Admitting: Internal Medicine

## 2017-08-02 ENCOUNTER — Other Ambulatory Visit: Payer: Self-pay | Admitting: Internal Medicine

## 2017-08-02 ENCOUNTER — Ambulatory Visit: Payer: BLUE CROSS/BLUE SHIELD | Admitting: Internal Medicine

## 2017-08-02 VITALS — BP 140/110 | HR 74 | Temp 98.4°F | Ht 61.0 in | Wt 182.0 lb

## 2017-08-02 DIAGNOSIS — F458 Other somatoform disorders: Secondary | ICD-10-CM

## 2017-08-02 DIAGNOSIS — D170 Benign lipomatous neoplasm of skin and subcutaneous tissue of head, face and neck: Secondary | ICD-10-CM

## 2017-08-02 DIAGNOSIS — R221 Localized swelling, mass and lump, neck: Secondary | ICD-10-CM | POA: Diagnosis not present

## 2017-08-02 DIAGNOSIS — R198 Other specified symptoms and signs involving the digestive system and abdomen: Secondary | ICD-10-CM

## 2017-08-02 DIAGNOSIS — R0989 Other specified symptoms and signs involving the circulatory and respiratory systems: Secondary | ICD-10-CM

## 2017-08-02 NOTE — Progress Notes (Signed)
   Subjective:    Patient ID: [name], female    DOB: Jun 05, 1965, 52 y.o.   MRN: [id number]  HPI 52 year old Female not seen here since 2014. Called about a lump or swelling in her neck. Heard about celebrity having thyroid cancer and was concerned. She is overweight. Has not had tenderness in neck nor recent viral illness.  SHx: married with adult children and 2 grandchildren. FHx: mother with hx CAD. Father died of complications of leukemia.  Saw Dr. Wilburn Cornelia for hoarseness in 2010 and had no thyromegaly.Thought to have globus sensation and reflux at the time  Review of Systems fells like food may not being going down well when she swallows but not sticking. May have globus sensation. Denies excessive stress.  PMH: Cervical cryosurgery outpatient 1991, left lower lobe pneumonia 2004, basal cell carcinoma right forehead 2006.  Benign right breast biopsy and lipoma removed from right shoulder 1993.  History of migraine headaches.  Keflex causes nausea and vomiting.    Objective:   Physical Exam  I do not palpate thyromegaly but has what looks to be fatty prominence mid neck betweenclavicles. It is not especially firm and not rubbery.      Assessment & Plan:  ? Lipoma neck- doubt thyromegaly  ?  Globus sensation  Plan: Ultrasound of thyroid. Free T4 and TSH.  Further instructions to follow-up following these study results.

## 2017-08-02 NOTE — Patient Instructions (Addendum)
To have ultrasound of thyroid and labs  are pending.

## 2017-08-03 ENCOUNTER — Ambulatory Visit
Admission: RE | Admit: 2017-08-03 | Discharge: 2017-08-03 | Disposition: A | Discharge status: Home / Self Care | Payer: PRIVATE HEALTH INSURANCE | Source: Ambulatory Visit | Attending: Internal Medicine | Admitting: Internal Medicine

## 2017-08-03 DIAGNOSIS — E041 Nontoxic single thyroid nodule: Secondary | ICD-10-CM | POA: Diagnosis not present

## 2017-08-03 DIAGNOSIS — R221 Localized swelling, mass and lump, neck: Secondary | ICD-10-CM

## 2017-08-03 LAB — T4, FREE: Free T4: 0.8 ng/dL (ref 0.8–1.8)

## 2017-08-03 LAB — TSH: TSH: 2.22 mIU/L

## 2017-10-16 ENCOUNTER — Ambulatory Visit: Payer: PRIVATE HEALTH INSURANCE | Admitting: Family Medicine

## 2017-11-08 ENCOUNTER — Ambulatory Visit (INDEPENDENT_AMBULATORY_CARE_PROVIDER_SITE_OTHER): Payer: BLUE CROSS/BLUE SHIELD | Admitting: Internal Medicine

## 2017-11-08 ENCOUNTER — Encounter: Payer: Self-pay | Admitting: Internal Medicine

## 2017-11-08 VITALS — BP 150/100 | HR 88 | Ht 61.0 in | Wt 188.0 lb

## 2017-11-08 DIAGNOSIS — R030 Elevated blood-pressure reading, without diagnosis of hypertension: Secondary | ICD-10-CM

## 2017-11-08 DIAGNOSIS — M2550 Pain in unspecified joint: Secondary | ICD-10-CM | POA: Diagnosis not present

## 2017-11-08 LAB — POCT URINALYSIS DIPSTICK
Appearance: NORMAL
Bilirubin, UA: NEGATIVE
Blood, UA: NEGATIVE
GLUCOSE UA: NEGATIVE
KETONES UA: NEGATIVE
Leukocytes, UA: NEGATIVE
Nitrite, UA: NEGATIVE
ODOR: NORMAL
Protein, UA: NEGATIVE
SPEC GRAV UA: 1.01 (ref 1.010–1.025)
Urobilinogen, UA: 0.2 E.U./dL
pH, UA: 6.5 (ref 5.0–8.0)

## 2017-11-08 MED ORDER — AMLODIPINE BESYLATE 5 MG PO TABS: ORAL_TABLET | ORAL | 0 refills | Status: DC

## 2017-11-08 NOTE — Progress Notes (Signed)
   Subjective:    Patient ID: [name], female    DOB: 27-Jul-1965, 52 y.o.   MRN: [id number]  HPI 52 year old Female seen April 2019 with elevated blood pressure and concern about fatty prominence mid neck between clavicles.  This was not a thyroid issue.  She had ultrasound of the thyroid showing no nodules and no thyromegaly.  Blood pressure at the time of that evaluation was 140/110.  She was anxious.  She had not been seen here prior to that since 2014.  History of GE reflux treated with PPI by Dr. Wilburn Cornelia in 2010.  Seen in 2014 with complaint of abdominal pain and was worried about cancer.  CT of the abdomen and pelvis was within normal limits and ultrasound of the gallbladder showed no gallstones.  History of anxiety about her health.  Seen in the emergency department December 2016 with right upper quadrant abdominal pain with negative ultrasound.  Also complaining of multi-joint pain.  Rheumatology studies were performed.  She has a negative CCP, slightly positive rheumatoid factor at 17, sed rate of 6, ANA negative.  Free T4 and TSH are normal.  CBC and C met are normal.  She is not fasting today.  Family history: Mother with history of coronary artery disease.  Father died with leukemia complications.  Social history: Married with adult children and 2 grandchildren.    Review of Systems see above     Objective:   Physical Exam Mild Heberden and Bouchard's nodes.  No other swollen joints.  No erythema of joints.  Funduscopic exam is benign.  TMs and pharynx are clear.  Neck is supple without JVD thyromegaly or carotid bruits.  Chest clear.  Cardiac exam regular rate and rhythm normal S1 and S2.  Extremities without edema.  Blood pressure is elevated at 150/100.  BMI is 35.52       Assessment & Plan:  Probable essential hypertension  Plan: Patient started on amlodipine 5 mg daily with follow-up in 2 weeks.  She should purchase home blood pressure monitor and provide  readings at that time.

## 2017-11-13 LAB — COMPLETE METABOLIC PANEL WITH GFR
AG RATIO: 1.9 (calc) (ref 1.0–2.5)
ALBUMIN MSPROF: 4.3 g/dL (ref 3.6–5.1)
ALT: 19 U/L (ref 6–29)
AST: 19 U/L (ref 10–35)
Alkaline phosphatase (APISO): 63 U/L (ref 33–130)
BILIRUBIN TOTAL: 0.3 mg/dL (ref 0.2–1.2)
BUN: 12 mg/dL (ref 7–25)
CALCIUM: 8.8 mg/dL (ref 8.6–10.4)
CHLORIDE: 101 mmol/L (ref 98–110)
CO2: 25 mmol/L (ref 20–32)
Creat: 0.73 mg/dL (ref 0.50–1.05)
GFR, EST AFRICAN AMERICAN: 111 mL/min/{1.73_m2} (ref >=60)
GFR, EST NON AFRICAN AMERICAN: 95 mL/min/{1.73_m2} (ref >=60)
GLUCOSE: 83 mg/dL (ref 65–99)
Globulin: 2.3 g/dL (calc) (ref 1.9–3.7)
Potassium: 3.9 mmol/L (ref 3.5–5.3)
Sodium: 137 mmol/L (ref 135–146)
TOTAL PROTEIN: 6.6 g/dL (ref 6.1–8.1)

## 2017-11-13 LAB — CBC WITH DIFFERENTIAL/PLATELET
Basophils Absolute: 52 cells/uL (ref 0–200)
Basophils Relative: 0.6 %
EOS ABS: 139 {cells}/uL (ref 15–500)
Eosinophils Relative: 1.6 %
HEMATOCRIT: 36 % (ref 35.0–45.0)
HEMOGLOBIN: 12.6 g/dL (ref 11.7–15.5)
LYMPHS ABS: 1931 {cells}/uL (ref 850–3900)
MCH: 30.4 pg (ref 27.0–33.0)
MCHC: 35 g/dL (ref 32.0–36.0)
MCV: 87 fL (ref 80.0–100.0)
MONOS PCT: 5.1 %
MPV: 10.8 fL (ref 7.5–12.5)
NEUTROS ABS: 6134 {cells}/uL (ref 1500–7800)
Neutrophils Relative %: 70.5 %
Platelets: 237 10*3/uL (ref 140–400)
RBC: 4.14 10*6/uL (ref 3.80–5.10)
RDW: 12.9 % (ref 11.0–15.0)
Total Lymphocyte: 22.2 %
WBC: 8.7 10*3/uL (ref 3.8–10.8)
WBCMIX: 444 {cells}/uL (ref 200–950)

## 2017-11-13 LAB — ANA: ANA: NEGATIVE

## 2017-11-13 LAB — SEDIMENTATION RATE: Sed Rate: 6 mm/h (ref 0–30)

## 2017-11-13 LAB — T4, FREE: FREE T4: 0.8 ng/dL (ref 0.8–1.8)

## 2017-11-13 LAB — RHEUMATOID FACTOR: RHEUMATOID FACTOR: 17 [IU]/mL — AB (ref <14)

## 2017-11-13 LAB — CYCLIC CITRUL PEPTIDE ANTIBODY, IGG

## 2017-11-13 LAB — TSH: TSH: 2.42 mIU/L

## 2017-11-21 NOTE — Patient Instructions (Signed)
Please come and get fasting lipid panel.  Start amlodipine 5 mg daily and follow-up in 2 weeks.

## 2017-11-22 ENCOUNTER — Encounter: Payer: Self-pay | Admitting: Internal Medicine

## 2017-11-22 ENCOUNTER — Ambulatory Visit (INDEPENDENT_AMBULATORY_CARE_PROVIDER_SITE_OTHER): Payer: BLUE CROSS/BLUE SHIELD | Admitting: Internal Medicine

## 2017-11-22 VITALS — BP 120/90 | HR 77 | Ht 61.0 in | Wt 185.0 lb

## 2017-11-22 DIAGNOSIS — R002 Palpitations: Secondary | ICD-10-CM | POA: Diagnosis not present

## 2017-11-22 DIAGNOSIS — I10 Essential (primary) hypertension: Secondary | ICD-10-CM | POA: Diagnosis not present

## 2017-11-22 DIAGNOSIS — Z0000 Encounter for general adult medical examination without abnormal findings: Secondary | ICD-10-CM | POA: Diagnosis not present

## 2017-11-22 MED ORDER — METOPROLOL TARTRATE 25 MG PO TABS: ORAL_TABLET | ORAL | 3 refills | Status: DC

## 2017-11-22 NOTE — Progress Notes (Signed)
   Subjective:    Patient ID: [name], female    DOB: 08-23-1965, 52 y.o.   MRN: [id number]  HPI Here today for follow up on HTN. Brings in multiple blood pressure readings.  Routine physical exam scheduled for September.  Social history: Patient married with adult children and grandchildren.  Social alcohol consumption.  Non-smoker.  Family history: Mother with history of coronary artery disease.  Father died with complications of leukemia.  She saw Dr. Wilburn Cornelia for hoarseness in 2010.  Had no thyromegaly and was thought to have globus sensation and reflux.  Currently on amlodipine 5 mg daily  Cervical cryosurgery outpatient 1991.  Left lower lobe pneumonia 2004.  Basal cell carcinoma right forehead 2006.  Benign right breast biopsy and lipoma removed from right shoulder 1993.  When seen here on July 18 she was complaining of palpitations and elevated blood pressure.  EKG at that time was normal.  No ectopy was present.  History of migraine headaches.  Keflex causes nausea and vomiting.    Review of Systems complaining of palpitations. Life is stressful trying to help out her mom who has medical issues in addition to her own family.     Objective:   Physical Exam Pulse in 77. BP 120/90  Chest clear; Cor RRR; Ext without edema     Assessment & Plan:  Palpitations-has normal EKG today  Essential hypertension-improved with Norvasc  Plan: Add metoprolol 25 mg daily in addition to Norvasc and follow-up in September at time of physical examination.  Continue to monitor blood pressure at home.

## 2017-11-23 LAB — VITAMIN D 25 HYDROXY (VIT D DEFICIENCY, FRACTURES): Vit D, 25-Hydroxy: 51 ng/mL (ref 30–100)

## 2017-12-05 ENCOUNTER — Other Ambulatory Visit: Payer: Self-pay | Admitting: Internal Medicine

## 2017-12-06 DIAGNOSIS — Z808 Family history of malignant neoplasm of other organs or systems: Secondary | ICD-10-CM | POA: Diagnosis not present

## 2017-12-06 DIAGNOSIS — D2372 Other benign neoplasm of skin of left lower limb, including hip: Secondary | ICD-10-CM | POA: Diagnosis not present

## 2017-12-06 DIAGNOSIS — L821 Other seborrheic keratosis: Secondary | ICD-10-CM | POA: Diagnosis not present

## 2017-12-06 DIAGNOSIS — Z85828 Personal history of other malignant neoplasm of skin: Secondary | ICD-10-CM | POA: Diagnosis not present

## 2017-12-16 ENCOUNTER — Encounter: Payer: Self-pay | Admitting: Internal Medicine

## 2017-12-16 NOTE — Patient Instructions (Signed)
Add metoprolol 25 mg daily to Norvasc 5 mg daily and follow-up in September at time of physical exam.

## 2018-01-04 ENCOUNTER — Other Ambulatory Visit: Payer: BLUE CROSS/BLUE SHIELD | Admitting: Internal Medicine

## 2018-01-04 DIAGNOSIS — Z1321 Encounter for screening for nutritional disorder: Secondary | ICD-10-CM

## 2018-01-04 DIAGNOSIS — I10 Essential (primary) hypertension: Secondary | ICD-10-CM | POA: Diagnosis not present

## 2018-01-04 DIAGNOSIS — E785 Hyperlipidemia, unspecified: Secondary | ICD-10-CM

## 2018-01-04 DIAGNOSIS — Z0000 Encounter for general adult medical examination without abnormal findings: Secondary | ICD-10-CM

## 2018-01-04 NOTE — Addendum Note (Signed)
Addended by: Mady Haagensen on: 01/04/2018 09:37 AM   Modules accepted: Orders

## 2018-01-05 LAB — COMPLETE METABOLIC PANEL WITH GFR
AG RATIO: 1.8 (calc) (ref 1.0–2.5)
ALKALINE PHOSPHATASE (APISO): 53 U/L (ref 33–130)
ALT: 16 U/L (ref 6–29)
AST: 17 U/L (ref 10–35)
Albumin: 4.1 g/dL (ref 3.6–5.1)
BILIRUBIN TOTAL: 0.5 mg/dL (ref 0.2–1.2)
BUN: 14 mg/dL (ref 7–25)
CHLORIDE: 104 mmol/L (ref 98–110)
CO2: 28 mmol/L (ref 20–32)
Calcium: 9 mg/dL (ref 8.6–10.4)
Creat: 0.93 mg/dL (ref 0.50–1.05)
GFR, Est African American: 82 mL/min/{1.73_m2} (ref >=60)
GFR, Est Non African American: 71 mL/min/{1.73_m2} (ref >=60)
GLUCOSE: 89 mg/dL (ref 65–99)
Globulin: 2.3 g/dL (calc) (ref 1.9–3.7)
POTASSIUM: 4.4 mmol/L (ref 3.5–5.3)
Sodium: 139 mmol/L (ref 135–146)
Total Protein: 6.4 g/dL (ref 6.1–8.1)

## 2018-01-05 LAB — CBC WITH DIFFERENTIAL/PLATELET
Basophils Absolute: 32 cells/uL (ref 0–200)
Basophils Relative: 0.6 %
EOS PCT: 2.5 %
Eosinophils Absolute: 133 cells/uL (ref 15–500)
HEMATOCRIT: 36 % (ref 35.0–45.0)
Hemoglobin: 12.5 g/dL (ref 11.7–15.5)
LYMPHS ABS: 1420 {cells}/uL (ref 850–3900)
MCH: 30.3 pg (ref 27.0–33.0)
MCHC: 34.7 g/dL (ref 32.0–36.0)
MCV: 87.2 fL (ref 80.0–100.0)
MONOS PCT: 6.8 %
MPV: 10.9 fL (ref 7.5–12.5)
NEUTROS ABS: 3355 {cells}/uL (ref 1500–7800)
NEUTROS PCT: 63.3 %
Platelets: 236 10*3/uL (ref 140–400)
RBC: 4.13 10*6/uL (ref 3.80–5.10)
RDW: 12.8 % (ref 11.0–15.0)
Total Lymphocyte: 26.8 %
WBC mixed population: 360 cells/uL (ref 200–950)
WBC: 5.3 10*3/uL (ref 3.8–10.8)

## 2018-01-05 LAB — TSH: TSH: 1.75 mIU/L

## 2018-01-05 LAB — VITAMIN D 25 HYDROXY (VIT D DEFICIENCY, FRACTURES): Vit D, 25-Hydroxy: 72 ng/mL (ref 30–100)

## 2018-01-05 LAB — LIPID PANEL
Cholesterol: 232 mg/dL — ABNORMAL HIGH (ref <200)
HDL: 57 mg/dL (ref >50)
LDL CHOLESTEROL (CALC): 149 mg/dL — AB
NON-HDL CHOLESTEROL (CALC): 175 mg/dL — AB (ref <130)
TRIGLYCERIDES: 140 mg/dL (ref <150)
Total CHOL/HDL Ratio: 4.1 (calc) (ref <5.0)

## 2018-01-06 ENCOUNTER — Other Ambulatory Visit: Payer: Self-pay | Admitting: Internal Medicine

## 2018-01-07 ENCOUNTER — Ambulatory Visit (INDEPENDENT_AMBULATORY_CARE_PROVIDER_SITE_OTHER): Payer: BLUE CROSS/BLUE SHIELD | Admitting: Internal Medicine

## 2018-01-07 ENCOUNTER — Encounter: Payer: Self-pay | Admitting: Internal Medicine

## 2018-01-07 ENCOUNTER — Other Ambulatory Visit (HOSPITAL_COMMUNITY)
Admission: RE | Admit: 2018-01-07 | Discharge: 2018-01-07 | Disposition: A | Discharge status: Home / Self Care | Payer: BLUE CROSS/BLUE SHIELD | Source: Ambulatory Visit | Attending: Internal Medicine | Admitting: Internal Medicine

## 2018-01-07 VITALS — BP 120/90 | HR 66 | Ht 61.0 in | Wt 185.0 lb

## 2018-01-07 DIAGNOSIS — Z124 Encounter for screening for malignant neoplasm of cervix: Secondary | ICD-10-CM

## 2018-01-07 DIAGNOSIS — E7800 Pure hypercholesterolemia, unspecified: Secondary | ICD-10-CM

## 2018-01-07 DIAGNOSIS — R002 Palpitations: Secondary | ICD-10-CM

## 2018-01-07 DIAGNOSIS — I10 Essential (primary) hypertension: Secondary | ICD-10-CM | POA: Diagnosis not present

## 2018-01-07 DIAGNOSIS — Z01419 Encounter for gynecological examination (general) (routine) without abnormal findings: Secondary | ICD-10-CM | POA: Diagnosis not present

## 2018-01-07 DIAGNOSIS — Z0000 Encounter for general adult medical examination without abnormal findings: Secondary | ICD-10-CM

## 2018-01-07 DIAGNOSIS — Z6834 Body mass index (BMI) 34.0-34.9, adult: Secondary | ICD-10-CM | POA: Diagnosis not present

## 2018-01-07 DIAGNOSIS — M2042 Other hammer toe(s) (acquired), left foot: Secondary | ICD-10-CM

## 2018-01-07 DIAGNOSIS — G5603 Carpal tunnel syndrome, bilateral upper limbs: Secondary | ICD-10-CM

## 2018-01-07 LAB — POCT URINALYSIS DIPSTICK
APPEARANCE: NORMAL
BILIRUBIN UA: NEGATIVE
Blood, UA: NEGATIVE
GLUCOSE UA: NEGATIVE
Leukocytes, UA: NEGATIVE
Nitrite, UA: NEGATIVE
ODOR: NORMAL
PH UA: 6.5 (ref 5.0–8.0)
Protein, UA: NEGATIVE
Spec Grav, UA: 1.01 (ref 1.010–1.025)
UROBILINOGEN UA: 0.2 U/dL

## 2018-01-07 MED ORDER — HYDROCHLOROTHIAZIDE 25 MG PO TABS: 25.0000 mg | ORAL_TABLET | Freq: Every day | ORAL | 1 refills | Status: DC

## 2018-01-07 NOTE — Progress Notes (Signed)
Subjective:    Patient ID: [name], female    DOB: October 27, 1965, 52 y.o.   MRN: [id number]  HPI 52 year old Female for health maintenance exam and evaluation of medical issues. Issues with obesity, HTN, palpitations and situational stress.  Currently on metoprolol which we gave her for palpitations and amlodipine 5 mg daily for hypertension.  In 2010 she saw Dr. Wilburn Cornelia for hoarseness.  Had no thyromegaly and was thought to have globus sensation and reflux.  Says she just started a new diet.  Feels that she does not have time for herself because she takes care of her family and her mother.  Keflex causes nausea and vomiting.  Was seen here July 18 complaining of palpitations and elevated blood pressure.  EKG at that time was normal without ectopy.  Cervical cryosurgery as an outpatient 1991.  Left lower lobe pneumonia 2004.  Basal cell carcinoma right forehead 2006.  Benign right breast biopsy and lipoma removed from right shoulder 1993.  History of migraine headaches.  Labs reviewed with her today.  Total cholesterol is 232 with normal triglycerides.  HDL is 57 and LDL is 149.  She does not want to be on statin medication.  TSH is normal.  Vitamin D level is normal and CBC is normal as is C- met  Social history: Married with adult children and 2 grandchildren.  Non-smoker.  Says that donuts are In her house for her children and husband.  She is tried to remove these items as they are excess calories.  Not getting a lot of physical exercise.  Family history: Mother with history of coronary artery disease.  Father died with complications of leukemia.        Review of Systems Had menstrual period Labor Day Sept 2.  Skipped a couple months before that. No hot flashes.  She has had some numbness in her hands at times which I think may actually be carpal tunnel syndrome.  Suggested referral to orthopedist but she declined.  Has developed hammertoe.  Explained that that could  be treated by orthopedist as well     Objective:   Physical Exam  Constitutional: She is oriented to person, place, and time. She appears well-developed and well-nourished. No distress.  HENT:  Head: Normocephalic.  Right Ear: External ear normal.  Left Ear: External ear normal.  Mouth/Throat: Oropharynx is clear and moist. No oropharyngeal exudate.  Eyes: Pupils are equal, round, and reactive to light. EOM are normal. Right eye exhibits no discharge. Left eye exhibits no discharge. No scleral icterus.  Neck: No JVD present. No thyromegaly present.  Cardiovascular: Normal rate, regular rhythm and normal heart sounds.  No murmur heard. Pulmonary/Chest: Effort normal and breath sounds normal. No stridor. No respiratory distress. She has no wheezes.  Breasts normal female without masses  Abdominal: Soft. Bowel sounds are normal. She exhibits no distension and no mass. There is no tenderness. There is no rebound and no guarding.  Genitourinary:  Genitourinary Comments: Pap taken.  Bimanual normal.  Musculoskeletal: She exhibits no edema.  Hammertoe left second toe.  Heberden's and Bouchard's nodes of hands  Lymphadenopathy:    She has no cervical adenopathy.  Neurological: She is alert and oriented to person, place, and time. She displays normal reflexes. No cranial nerve deficit. Coordination normal.  Skin: Skin is warm and dry. She is not diaphoretic.  Psychiatric: She has a normal mood and affect. Her behavior is normal. Judgment and thought content normal.  Vitals  reviewed.         Assessment & Plan:  Essential hypertension-add HCTZ to Norvasc and metoprolol  Palpitations-improved with metoprolol  Obesity-discussion about diet exercise and weight loss today.  She has many excuses.  Wants to give new diet of try.  Follow-up in 4 weeks.  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  Will need basic metabolic panel at that time.

## 2018-01-07 NOTE — Patient Instructions (Addendum)
Add HCTZ 25 mg daily to Norvasc and metoprolol.  Follow-up in 4 weeks.  Encourage patient to diet exercise and lose weight.  She does not want to be on statin therapy

## 2018-01-09 LAB — CYTOLOGY - PAP
Diagnosis: NEGATIVE
HPV: NOT DETECTED

## 2018-02-05 ENCOUNTER — Other Ambulatory Visit: Payer: Self-pay | Admitting: Internal Medicine

## 2018-02-11 ENCOUNTER — Encounter: Payer: Self-pay | Admitting: Internal Medicine

## 2018-02-11 ENCOUNTER — Ambulatory Visit (INDEPENDENT_AMBULATORY_CARE_PROVIDER_SITE_OTHER): Payer: BLUE CROSS/BLUE SHIELD | Admitting: Internal Medicine

## 2018-02-11 VITALS — BP 110/80 | HR 60 | Ht 61.0 in | Wt 178.0 lb

## 2018-02-11 DIAGNOSIS — I10 Essential (primary) hypertension: Secondary | ICD-10-CM

## 2018-02-11 DIAGNOSIS — Z6833 Body mass index (BMI) 33.0-33.9, adult: Secondary | ICD-10-CM

## 2018-02-11 DIAGNOSIS — Z5181 Encounter for therapeutic drug level monitoring: Secondary | ICD-10-CM

## 2018-02-11 DIAGNOSIS — E7800 Pure hypercholesterolemia, unspecified: Secondary | ICD-10-CM

## 2018-02-11 DIAGNOSIS — Z79899 Other long term (current) drug therapy: Secondary | ICD-10-CM | POA: Diagnosis not present

## 2018-02-11 LAB — BASIC METABOLIC PANEL
BUN: 16 mg/dL (ref 7–25)
CHLORIDE: 102 mmol/L (ref 98–110)
CO2: 29 mmol/L (ref 20–32)
CREATININE: 0.96 mg/dL (ref 0.50–1.05)
Calcium: 9.2 mg/dL (ref 8.6–10.4)
Glucose, Bld: 85 mg/dL (ref 65–99)
POTASSIUM: 3.9 mmol/L (ref 3.5–5.3)
Sodium: 140 mmol/L (ref 135–146)

## 2018-02-11 NOTE — Progress Notes (Signed)
   Subjective:    Patient ID: [name], female    DOB: 04/25/1965, 52 y.o.   MRN: [id number]  HPI At last visit HCTZ was added to Norvasc and metoprolol and she is here for follow-up today of essential hypertension.  Blood pressure was 120/90 at last visit.  She also has hyperlipidemia which is pure hypercholesterolemia.  She does not want to be on statin medication.  History of palpitations treated with metoprolol. Walking 2 miles a day 5 days a week. Watching diet. Lost 7 pounds.basic metabolic panel pending.    Review of Systems no palpitations.     Objective:   Physical Exam  Constitutional: She appears well-developed and well-nourished.  Cardiovascular: Normal rate, regular rhythm and normal heart sounds.  No murmur heard. Pulmonary/Chest: No stridor. No respiratory distress. She has no wheezes. She has no rales.  Musculoskeletal: She exhibits no edema.  Skin: Skin is warm and dry.  Vitals reviewed.         Assessment & Plan:  Essential HTN- improved BP reading with diuretic.Continue amlodipine,HCTZ and metoprolol. B-met drawn since diuretic was started last visit.  Hyperlipidemia- does not want to be on statin.  Obesity- now exercising and dieting. Lost 7 pounds since last visit  Plan:RTC late January for follow up. continue same meds. Continue diet and exercise.

## 2018-02-11 NOTE — Patient Instructions (Addendum)
Contiue current meds. Continue diet and exercise. RTC late January.

## 2018-04-06 ENCOUNTER — Other Ambulatory Visit: Payer: Self-pay | Admitting: Internal Medicine

## 2018-04-12 ENCOUNTER — Telehealth: Payer: Self-pay | Admitting: Internal Medicine

## 2018-04-12 NOTE — Telephone Encounter (Signed)
Patient called to say she wants to hold off on the sleep study until the end of January.  She has too much going on at the present time.  She is not having any problems at the present time.  She says she knows she probably snores, however, this is just something that her dentist has started doing and she feels she must have checked the right boxes.  And, that's probably why he wants her to have the test.  So, she would like to discuss this with you when she comes in for her follow up visit on 05/21/18.    FYI.

## 2018-04-12 NOTE — Telephone Encounter (Signed)
So if someone contacts her from [facility] she should tell them she wants to come later.

## 2018-05-06 DIAGNOSIS — Z1211 Encounter for screening for malignant neoplasm of colon: Secondary | ICD-10-CM | POA: Diagnosis not present

## 2018-05-06 DIAGNOSIS — D123 Benign neoplasm of transverse colon: Secondary | ICD-10-CM | POA: Diagnosis not present

## 2018-05-06 LAB — HM COLONOSCOPY

## 2018-05-08 DIAGNOSIS — D123 Benign neoplasm of transverse colon: Secondary | ICD-10-CM | POA: Diagnosis not present

## 2018-05-10 ENCOUNTER — Encounter: Payer: Self-pay | Admitting: Internal Medicine

## 2018-05-21 ENCOUNTER — Ambulatory Visit: Payer: BLUE CROSS/BLUE SHIELD | Admitting: Internal Medicine

## 2018-06-18 ENCOUNTER — Ambulatory Visit: Payer: BLUE CROSS/BLUE SHIELD | Admitting: Internal Medicine

## 2018-07-15 ENCOUNTER — Other Ambulatory Visit: Payer: Self-pay | Admitting: Internal Medicine

## 2018-07-16 ENCOUNTER — Ambulatory Visit: Payer: BLUE CROSS/BLUE SHIELD | Admitting: Internal Medicine

## 2018-08-04 ENCOUNTER — Other Ambulatory Visit: Payer: Self-pay | Admitting: Internal Medicine

## 2018-08-06 ENCOUNTER — Other Ambulatory Visit: Payer: Self-pay

## 2018-08-06 MED ORDER — METOPROLOL TARTRATE 25 MG PO TABS: ORAL_TABLET | ORAL | 0 refills | Status: DC

## 2018-08-06 NOTE — Telephone Encounter (Signed)
She was supposed to come in late January would you like to schedule a tele visit?

## 2018-09-02 ENCOUNTER — Ambulatory Visit: Payer: BLUE CROSS/BLUE SHIELD | Admitting: Internal Medicine

## 2018-09-10 ENCOUNTER — Ambulatory Visit: Payer: BLUE CROSS/BLUE SHIELD | Admitting: Internal Medicine

## 2018-10-31 ENCOUNTER — Ambulatory Visit (INDEPENDENT_AMBULATORY_CARE_PROVIDER_SITE_OTHER): Payer: BC Managed Care – PPO | Admitting: Internal Medicine

## 2018-10-31 ENCOUNTER — Other Ambulatory Visit: Payer: Self-pay

## 2018-10-31 ENCOUNTER — Encounter: Payer: Self-pay | Admitting: Internal Medicine

## 2018-10-31 VITALS — BP 110/80 | HR 60 | Temp 98.4°F | Ht 61.0 in | Wt 177.0 lb

## 2018-10-31 DIAGNOSIS — Z6833 Body mass index (BMI) 33.0-33.9, adult: Secondary | ICD-10-CM

## 2018-10-31 DIAGNOSIS — R002 Palpitations: Secondary | ICD-10-CM

## 2018-10-31 DIAGNOSIS — E7800 Pure hypercholesterolemia, unspecified: Secondary | ICD-10-CM

## 2018-10-31 DIAGNOSIS — I10 Essential (primary) hypertension: Secondary | ICD-10-CM | POA: Diagnosis not present

## 2018-10-31 MED ORDER — METOPROLOL TARTRATE 25 MG PO TABS: ORAL_TABLET | ORAL | 0 refills | Status: DC

## 2018-10-31 NOTE — Progress Notes (Signed)
   Subjective:    Patient ID: [name], female    DOB: 1966-03-22, 53 y.o.   MRN: [id number]  HPI 53 year old female in today for follow-up of hypertension.  Is on metoprolol, Norvasc and HCTZ.  History of pure hypercholesterolemia and has not wanted to be on statin medication.  Metoprolol prescribed for palpitations in addition to blood pressure control.  In October had lost 7 pounds.    Review of Systems     Objective:   Physical Exam Wt. 177 pounds. Was 185 pounds last September.  Neck supple.  Chest clear to auscultation.  Cardiac exam regular rate and rhythm normal S1 and S2.  Extremities without edema. Blood pressure 110/80, pulse 60, temperature 98.4 degrees orally      Assessment & Plan:  Essential hypertension-stable on current regimen  History of palpitations-treated with metoprolol and stable  BMI 33.44 continue to work on diet exercise and weight loss.  Have recommended Dr. Migdalia Dk clinic previously  Plan: Follow-up in October for annual health maintenance exam and fasting labs.  Continue to encourage diet exercise and weight loss.  25 minutes spent with patient including reviewing chart, time spent with office visit, medical decision making.

## 2018-11-11 ENCOUNTER — Other Ambulatory Visit: Payer: Self-pay

## 2018-11-11 MED ORDER — METOPROLOL TARTRATE 25 MG PO TABS: ORAL_TABLET | ORAL | 1 refills | Status: DC

## 2018-12-09 NOTE — Patient Instructions (Signed)
It was a pleasure to see you today.  Continue to work on diet exercise and weight loss.  Consider Dr. Migdalia Dk clinic.  Continue current medications and follow-up in October for annual physical exam and fasting labs.

## 2019-01-30 ENCOUNTER — Other Ambulatory Visit: Payer: Self-pay

## 2019-01-30 DIAGNOSIS — Z20822 Contact with and (suspected) exposure to covid-19: Secondary | ICD-10-CM

## 2019-01-30 DIAGNOSIS — Z20828 Contact with and (suspected) exposure to other viral communicable diseases: Secondary | ICD-10-CM | POA: Diagnosis not present

## 2019-01-31 ENCOUNTER — Telehealth: Payer: Self-pay | Admitting: Internal Medicine

## 2019-01-31 LAB — NOVEL CORONAVIRUS, NAA: SARS-CoV-2, NAA: NOT DETECTED

## 2019-01-31 MED ORDER — AMLODIPINE BESYLATE 5 MG PO TABS: 5.0000 mg | ORAL_TABLET | Freq: Every morning | ORAL | 1 refills | Status: DC

## 2019-01-31 NOTE — Telephone Encounter (Signed)
Received Fax RX request from  [place]  Medication - amLODipine (NORVASC) 5 MG tablet    Last Refill - 11/02/18  Last OV - 10/31/18  Last CPE - 11/22/17

## 2019-02-04 ENCOUNTER — Other Ambulatory Visit: Payer: BC Managed Care – PPO | Admitting: Internal Medicine

## 2019-02-04 ENCOUNTER — Other Ambulatory Visit: Payer: Self-pay

## 2019-02-04 DIAGNOSIS — E7800 Pure hypercholesterolemia, unspecified: Secondary | ICD-10-CM | POA: Diagnosis not present

## 2019-02-04 DIAGNOSIS — I10 Essential (primary) hypertension: Secondary | ICD-10-CM

## 2019-02-04 DIAGNOSIS — Z0000 Encounter for general adult medical examination without abnormal findings: Secondary | ICD-10-CM

## 2019-02-04 DIAGNOSIS — E785 Hyperlipidemia, unspecified: Secondary | ICD-10-CM | POA: Diagnosis not present

## 2019-02-04 DIAGNOSIS — G5603 Carpal tunnel syndrome, bilateral upper limbs: Secondary | ICD-10-CM

## 2019-02-04 DIAGNOSIS — Z1321 Encounter for screening for nutritional disorder: Secondary | ICD-10-CM

## 2019-02-05 ENCOUNTER — Other Ambulatory Visit: Payer: Self-pay | Admitting: Internal Medicine

## 2019-02-05 DIAGNOSIS — Z1231 Encounter for screening mammogram for malignant neoplasm of breast: Secondary | ICD-10-CM | POA: Diagnosis not present

## 2019-02-05 LAB — COMPLETE METABOLIC PANEL WITH GFR
AG Ratio: 1.9 (calc) (ref 1.0–2.5)
ALT: 20 U/L (ref 6–29)
AST: 19 U/L (ref 10–35)
Albumin: 4.4 g/dL (ref 3.6–5.1)
Alkaline phosphatase (APISO): 54 U/L (ref 37–153)
BUN: 20 mg/dL (ref 7–25)
CO2: 29 mmol/L (ref 20–32)
Calcium: 9.2 mg/dL (ref 8.6–10.4)
Chloride: 101 mmol/L (ref 98–110)
Creat: 0.81 mg/dL (ref 0.50–1.05)
GFR, Est African American: 97 mL/min/{1.73_m2} (ref >=60)
GFR, Est Non African American: 84 mL/min/{1.73_m2} (ref >=60)
Globulin: 2.3 g/dL (calc) (ref 1.9–3.7)
Glucose, Bld: 96 mg/dL (ref 65–99)
Potassium: 3.9 mmol/L (ref 3.5–5.3)
Sodium: 137 mmol/L (ref 135–146)
Total Bilirubin: 0.4 mg/dL (ref 0.2–1.2)
Total Protein: 6.7 g/dL (ref 6.1–8.1)

## 2019-02-05 LAB — CBC WITH DIFFERENTIAL/PLATELET
Absolute Monocytes: 400 cells/uL (ref 200–950)
Basophils Absolute: 32 cells/uL (ref 0–200)
Basophils Relative: 0.6 %
Eosinophils Absolute: 140 cells/uL (ref 15–500)
Eosinophils Relative: 2.6 %
HCT: 39.2 % (ref 35.0–45.0)
Hemoglobin: 13.2 g/dL (ref 11.7–15.5)
Lymphs Abs: 1177 cells/uL (ref 850–3900)
MCH: 30.7 pg (ref 27.0–33.0)
MCHC: 33.7 g/dL (ref 32.0–36.0)
MCV: 91.2 fL (ref 80.0–100.0)
MPV: 11.1 fL (ref 7.5–12.5)
Monocytes Relative: 7.4 %
Neutro Abs: 3650 cells/uL (ref 1500–7800)
Neutrophils Relative %: 67.6 %
Platelets: 227 10*3/uL (ref 140–400)
RBC: 4.3 10*6/uL (ref 3.80–5.10)
RDW: 12.4 % (ref 11.0–15.0)
Total Lymphocyte: 21.8 %
WBC: 5.4 10*3/uL (ref 3.8–10.8)

## 2019-02-05 LAB — TSH: TSH: 2.52 mIU/L

## 2019-02-05 LAB — LIPID PANEL
Cholesterol: 253 mg/dL — ABNORMAL HIGH (ref <200)
HDL: 70 mg/dL (ref >=50)
LDL Cholesterol (Calc): 162 mg/dL (calc) — ABNORMAL HIGH
Non-HDL Cholesterol (Calc): 183 mg/dL (calc) — ABNORMAL HIGH (ref <130)
Total CHOL/HDL Ratio: 3.6 (calc) (ref <5.0)
Triglycerides: 100 mg/dL (ref <150)

## 2019-02-05 LAB — HM MAMMOGRAPHY

## 2019-02-05 LAB — VITAMIN D 25 HYDROXY (VIT D DEFICIENCY, FRACTURES): Vit D, 25-Hydroxy: 30 ng/mL (ref 30–100)

## 2019-02-06 ENCOUNTER — Encounter: Payer: Self-pay | Admitting: Internal Medicine

## 2019-02-06 ENCOUNTER — Other Ambulatory Visit: Payer: Self-pay

## 2019-02-06 ENCOUNTER — Ambulatory Visit (INDEPENDENT_AMBULATORY_CARE_PROVIDER_SITE_OTHER): Payer: BC Managed Care – PPO | Admitting: Internal Medicine

## 2019-02-06 VITALS — BP 120/60 | HR 60 | Temp 98.0°F | Ht 61.0 in | Wt 183.0 lb

## 2019-02-06 DIAGNOSIS — E7800 Pure hypercholesterolemia, unspecified: Secondary | ICD-10-CM | POA: Diagnosis not present

## 2019-02-06 DIAGNOSIS — Z0000 Encounter for general adult medical examination without abnormal findings: Secondary | ICD-10-CM

## 2019-02-06 DIAGNOSIS — I10 Essential (primary) hypertension: Secondary | ICD-10-CM | POA: Diagnosis not present

## 2019-02-06 DIAGNOSIS — Z6834 Body mass index (BMI) 34.0-34.9, adult: Secondary | ICD-10-CM | POA: Diagnosis not present

## 2019-02-06 DIAGNOSIS — F439 Reaction to severe stress, unspecified: Secondary | ICD-10-CM

## 2019-02-06 LAB — POCT URINALYSIS DIPSTICK
Appearance: NEGATIVE
Bilirubin, UA: NEGATIVE
Blood, UA: NEGATIVE
Glucose, UA: NEGATIVE
Ketones, UA: NEGATIVE
Leukocytes, UA: NEGATIVE
Nitrite, UA: NEGATIVE
Odor: NEGATIVE
Protein, UA: NEGATIVE
Spec Grav, UA: 1.02 (ref 1.010–1.025)
Urobilinogen, UA: 0.2 E.U./dL
pH, UA: 6.5 (ref 5.0–8.0)

## 2019-02-06 MED ORDER — ROSUVASTATIN CALCIUM 5 MG PO TABS: ORAL_TABLET | ORAL | 1 refills | Status: DC

## 2019-02-06 NOTE — Patient Instructions (Addendum)
Start statin. Work on diet exercise and weight loss.  RTC in 3 months.  Continue other medications as previously prescribed.

## 2019-02-06 NOTE — Progress Notes (Signed)
   Subjective:    Patient ID: [name], female    DOB: [birth year], 53 y.o.   MRN: [id number]  HPI 53 year old Female in today for health maintenance exam and evaluation of medical issues.  History of obesity, hypertension, palpitations and situational stress.  Feels she does not have time for herself.  Takes care of her mother and everyone else.  In 2010 she saw Dr. Wilburn Cornelia for hoarseness.  Had no thyromegaly and was thought to have globus sensation and reflux.  Keflex causes nausea and vomiting.  Had colonoscopy January 2020 at [facility] endoscopy  Seen November 08, 2017 complaining of palpitations and elevated blood pressure.  EKG at that time was normal without ectopy.  History of cervical cryotherapy as an outpatient in 1991.  Left lower lobe pneumonia 2004.  Basal cell carcinoma right forehead 2006.  Benign right breast biopsy and lipoma removed from right shoulder 1993.  History of migraine headaches.  Social history: Married with adult children and 2 grandchildren.  Non-smoker.  Apparently keeps doughnuts and other treats in her house for her husband and children.  She has tried to remove these items as they are excess calories.  Not getting a lot of physical exercise.  Family history: Mother with history of coronary artery disease.  Father died with complications of leukemia.    Review of Systems  Constitutional: Positive for fatigue.  Respiratory: Negative.   Cardiovascular:       History of palpitations controlled with beta-blocker  Neurological:       History of migraine headaches  Psychiatric/Behavioral:       Situational stress with pandemic and family issues   she had a Covid-19 exposure early October and was tested at [facility] test site and was negative for COVID-19.  Discussion about social distancing today as well as importance of wearing masks.     Objective:   Physical Exam Blood pressure 120/60 BMI 34.58 weight 193 pounds pulse 60 afebrile  Skin warm  and dry.  Nodes none.  TMs and pharynx are clear.  Neck is supple without JVD thyromegaly or carotid bruits.  Chest clear to auscultation.  Cardiac exam regular rate and rhythm normal S1 and S2 without murmurs or gallops.  Abdomen obese soft nondistended without hepatosplenomegaly masses or tenderness.  Pelvic exam Pap taken in 2019.  Bimanual normal.  No lower extremity pitting edema.  Neuro no gross focal deficits.  Judgment and affect and thought process normal.       Assessment & Plan:  Essential hypertension-stable on current regimen  History of palpitations-control with beta-blocker  Obesity-BMI 34.58.  Recommended Dr. Migdalia Dk clinic but I do not think she is interested at this point in time.  Hyperlipidemia-total cholesterol 253, LDL 162, HDL 70 triglycerides 100.  Begin Crestor 5 mg 3 times a week.  Recommend follow-up in 3 months with regard to cholesterol management.  Recommend follow-up in 3 months regarding lipid management regarding lipid panel and liver functions on Crestor and continue current medications.  Encourage diet exercise and weight loss.

## 2019-04-06 DIAGNOSIS — J069 Acute upper respiratory infection, unspecified: Secondary | ICD-10-CM | POA: Diagnosis not present

## 2019-04-06 DIAGNOSIS — Z20828 Contact with and (suspected) exposure to other viral communicable diseases: Secondary | ICD-10-CM | POA: Diagnosis not present

## 2019-07-16 ENCOUNTER — Telehealth: Payer: Self-pay | Admitting: Internal Medicine

## 2019-07-16 NOTE — Telephone Encounter (Signed)
LVM to call office to schedule 3 month follow up that was due in January.

## 2019-07-30 ENCOUNTER — Other Ambulatory Visit: Payer: Self-pay | Admitting: Internal Medicine

## 2019-07-30 MED ORDER — METOPROLOL TARTRATE 25 MG PO TABS: ORAL_TABLET | ORAL | 0 refills | Status: DC

## 2019-07-30 MED ORDER — AMLODIPINE BESYLATE 5 MG PO TABS: 5.0000 mg | ORAL_TABLET | Freq: Every morning | ORAL | 0 refills | Status: DC

## 2019-07-30 NOTE — Telephone Encounter (Signed)
Patient called and schedule appointment

## 2019-07-30 NOTE — Telephone Encounter (Signed)
Left message on cell phone- Rx denied until we hear from her. Needs appt.

## 2019-07-30 NOTE — Telephone Encounter (Signed)
Received Fax RX request from  [facility] [place], [place] - [address] Phone:  [phone]  Fax:  [phone]       Medication - amLODipine (NORVASC) 5 MG tablet / metoprolol tartrate (LOPRESSOR) 25 MG tablet  Last Refill - 05/01/2019  Last OV - 02/06/19  Last CPE - 02/06/19  Next Appointment - 3 month follow up was due in January. I left voice mail on 07/16/2019 to call office and schedule, no call back.

## 2019-08-04 ENCOUNTER — Other Ambulatory Visit: Payer: Self-pay

## 2019-08-04 MED ORDER — ROSUVASTATIN CALCIUM 5 MG PO TABS: ORAL_TABLET | ORAL | 1 refills | Status: DC

## 2019-08-07 ENCOUNTER — Other Ambulatory Visit: Payer: Self-pay

## 2019-08-07 ENCOUNTER — Other Ambulatory Visit: Payer: BC Managed Care – PPO | Admitting: Internal Medicine

## 2019-08-07 DIAGNOSIS — E7800 Pure hypercholesterolemia, unspecified: Secondary | ICD-10-CM

## 2019-08-08 LAB — LIPID PANEL
Cholesterol: 261 mg/dL — ABNORMAL HIGH (ref <200)
HDL: 68 mg/dL (ref >=50)
LDL Cholesterol (Calc): 164 mg/dL (calc) — ABNORMAL HIGH
Non-HDL Cholesterol (Calc): 193 mg/dL (calc) — ABNORMAL HIGH (ref <130)
Total CHOL/HDL Ratio: 3.8 (calc) (ref <5.0)
Triglycerides: 145 mg/dL (ref <150)

## 2019-08-08 LAB — HEPATIC FUNCTION PANEL
AG Ratio: 2 (calc) (ref 1.0–2.5)
ALT: 22 U/L (ref 6–29)
AST: 21 U/L (ref 10–35)
Albumin: 4.5 g/dL (ref 3.6–5.1)
Alkaline phosphatase (APISO): 60 U/L (ref 37–153)
Bilirubin, Direct: 0.1 mg/dL (ref 0.0–0.2)
Globulin: 2.2 g/dL (calc) (ref 1.9–3.7)
Indirect Bilirubin: 0.4 mg/dL (calc) (ref 0.2–1.2)
Total Bilirubin: 0.5 mg/dL (ref 0.2–1.2)
Total Protein: 6.7 g/dL (ref 6.1–8.1)

## 2019-08-15 ENCOUNTER — Other Ambulatory Visit: Payer: Self-pay

## 2019-08-15 ENCOUNTER — Encounter: Payer: Self-pay | Admitting: Internal Medicine

## 2019-08-15 ENCOUNTER — Ambulatory Visit (INDEPENDENT_AMBULATORY_CARE_PROVIDER_SITE_OTHER): Payer: BC Managed Care – PPO | Admitting: Internal Medicine

## 2019-08-15 VITALS — BP 120/80 | HR 68 | Temp 98.0°F | Ht 61.0 in | Wt 191.0 lb

## 2019-08-15 DIAGNOSIS — I10 Essential (primary) hypertension: Secondary | ICD-10-CM | POA: Diagnosis not present

## 2019-08-15 DIAGNOSIS — E7800 Pure hypercholesterolemia, unspecified: Secondary | ICD-10-CM | POA: Diagnosis not present

## 2019-08-15 DIAGNOSIS — Z6836 Body mass index (BMI) 36.0-36.9, adult: Secondary | ICD-10-CM

## 2019-08-15 MED ORDER — ROSUVASTATIN CALCIUM 5 MG PO TABS: 5.0000 mg | ORAL_TABLET | Freq: Every day | ORAL | 0 refills | Status: DC

## 2019-08-15 NOTE — Progress Notes (Signed)
   Subjective:    Patient ID: [name], female    DOB: [birth year], 54 y.o.   MRN: [id number]  HPI  54 year old Female seen for follow-up of hyperlipidemia.  Had fasting labs 08/07/2019.  Total cholesterol was 261 and previously had been 253 6 months ago.  LDL was 162 6 months ago and is now 164. Has been taking rosuvastatin 3 times a week but maybe not consistently.  She will now take it daily with follow-up in early January.   Review of Systems see above     Objective:   Physical Exam  Blood pressure excellent at 120/80.  Weight is 191 pounds BMI 36.89.  Pulse 68.  Chest clear to auscultation.  No thyromegaly.  No carotid bruits.  Cardiac exam regular rate and rhythm.  No lower extremity pitting edema      Assessment & Plan:  BMI 36.89-have suggested Dr. Migdalia Dk clinic but patient not ready to do that just yet  Essential hypertension-stable with metoprolol and HCTZ as well as amlodipine  Pure hypercholesterolemia-begin to take rosuvastatin daily instead of 3 times weekly  Plan: Continue to work on diet and exercise.  Take rosuvastatin daily follow-up in early June with lipid panel liver functions.  Will have office visit at that time.  Hyperlipidemia

## 2019-08-21 NOTE — Patient Instructions (Signed)
Continue same antihypertensive medication.  Take rosuvastatin daily.  Follow-up in early June.  Continue to work on diet exercise and weight loss.

## 2019-09-21 ENCOUNTER — Telehealth: Payer: Self-pay | Admitting: Internal Medicine

## 2019-09-21 MED ORDER — METOPROLOL TARTRATE 25 MG PO TABS: ORAL_TABLET | ORAL | 0 refills | Status: DC

## 2019-09-21 MED ORDER — AMLODIPINE BESYLATE 5 MG PO TABS: 5.0000 mg | ORAL_TABLET | Freq: Every morning | ORAL | 0 refills | Status: DC

## 2019-09-21 NOTE — Telephone Encounter (Signed)
Refill Amlodipine and metoprolol x 90 days. CPE due in October and needs to be booked.

## 2019-09-26 ENCOUNTER — Other Ambulatory Visit: Payer: BC Managed Care – PPO | Admitting: Internal Medicine

## 2019-09-26 ENCOUNTER — Other Ambulatory Visit: Payer: Self-pay

## 2019-09-26 DIAGNOSIS — E7800 Pure hypercholesterolemia, unspecified: Secondary | ICD-10-CM

## 2019-09-26 LAB — HEPATIC FUNCTION PANEL
AG Ratio: 1.9 (calc) (ref 1.0–2.5)
ALT: 20 U/L (ref 6–29)
AST: 21 U/L (ref 10–35)
Albumin: 4.3 g/dL (ref 3.6–5.1)
Alkaline phosphatase (APISO): 58 U/L (ref 37–153)
Bilirubin, Direct: 0.1 mg/dL (ref 0.0–0.2)
Globulin: 2.3 g/dL (calc) (ref 1.9–3.7)
Indirect Bilirubin: 0.4 mg/dL (calc) (ref 0.2–1.2)
Total Bilirubin: 0.5 mg/dL (ref 0.2–1.2)
Total Protein: 6.6 g/dL (ref 6.1–8.1)

## 2019-09-26 LAB — LIPID PANEL
Cholesterol: 220 mg/dL — ABNORMAL HIGH (ref <200)
HDL: 68 mg/dL (ref >=50)
LDL Cholesterol (Calc): 125 mg/dL (calc) — ABNORMAL HIGH
Non-HDL Cholesterol (Calc): 152 mg/dL (calc) — ABNORMAL HIGH (ref <130)
Total CHOL/HDL Ratio: 3.2 (calc) (ref <5.0)
Triglycerides: 155 mg/dL — ABNORMAL HIGH (ref <150)

## 2019-12-07 DIAGNOSIS — M791 Myalgia, unspecified site: Secondary | ICD-10-CM | POA: Diagnosis not present

## 2019-12-07 DIAGNOSIS — Z1152 Encounter for screening for COVID-19: Secondary | ICD-10-CM | POA: Diagnosis not present

## 2019-12-23 ENCOUNTER — Other Ambulatory Visit: Payer: Self-pay

## 2019-12-23 MED ORDER — METOPROLOL TARTRATE 25 MG PO TABS: ORAL_TABLET | ORAL | 0 refills | Status: DC

## 2019-12-23 MED ORDER — AMLODIPINE BESYLATE 5 MG PO TABS: 5.0000 mg | ORAL_TABLET | Freq: Every morning | ORAL | 0 refills | Status: DC

## 2020-01-05 ENCOUNTER — Telehealth: Payer: Self-pay | Admitting: Internal Medicine

## 2020-01-05 MED ORDER — ROSUVASTATIN CALCIUM 5 MG PO TABS: 5.0000 mg | ORAL_TABLET | Freq: Every day | ORAL | 0 refills | Status: DC

## 2020-01-05 NOTE — Telephone Encounter (Signed)
[name] [phone]  [name] called to say she just run out of below medication and has been substituting some with milk thistle, but what should she do until her appointment in October. She does not want to start all over.

## 2020-01-05 NOTE — Telephone Encounter (Signed)
Refill Crestor until appt. No need to take milk thistle

## 2020-02-09 ENCOUNTER — Other Ambulatory Visit: Payer: Self-pay

## 2020-02-09 ENCOUNTER — Other Ambulatory Visit: Payer: BC Managed Care – PPO | Admitting: Internal Medicine

## 2020-02-09 DIAGNOSIS — Z1321 Encounter for screening for nutritional disorder: Secondary | ICD-10-CM

## 2020-02-09 DIAGNOSIS — Z1329 Encounter for screening for other suspected endocrine disorder: Secondary | ICD-10-CM

## 2020-02-09 DIAGNOSIS — Z0000 Encounter for general adult medical examination without abnormal findings: Secondary | ICD-10-CM | POA: Diagnosis not present

## 2020-02-09 DIAGNOSIS — E7800 Pure hypercholesterolemia, unspecified: Secondary | ICD-10-CM | POA: Diagnosis not present

## 2020-02-09 DIAGNOSIS — I10 Essential (primary) hypertension: Secondary | ICD-10-CM | POA: Diagnosis not present

## 2020-02-10 LAB — COMPLETE METABOLIC PANEL WITH GFR
AG Ratio: 2 (calc) (ref 1.0–2.5)
ALT: 19 U/L (ref 6–29)
AST: 22 U/L (ref 10–35)
Albumin: 4.5 g/dL (ref 3.6–5.1)
Alkaline phosphatase (APISO): 55 U/L (ref 37–153)
BUN: 17 mg/dL (ref 7–25)
CO2: 28 mmol/L (ref 20–32)
Calcium: 9.6 mg/dL (ref 8.6–10.4)
Chloride: 102 mmol/L (ref 98–110)
Creat: 0.89 mg/dL (ref 0.50–1.05)
GFR, Est African American: 86 mL/min/{1.73_m2} (ref >=60)
GFR, Est Non African American: 74 mL/min/{1.73_m2} (ref >=60)
Globulin: 2.2 g/dL (calc) (ref 1.9–3.7)
Glucose, Bld: 92 mg/dL (ref 65–99)
Potassium: 4 mmol/L (ref 3.5–5.3)
Sodium: 140 mmol/L (ref 135–146)
Total Bilirubin: 0.7 mg/dL (ref 0.2–1.2)
Total Protein: 6.7 g/dL (ref 6.1–8.1)

## 2020-02-10 LAB — LIPID PANEL
Cholesterol: 170 mg/dL (ref <200)
HDL: 64 mg/dL (ref >=50)
LDL Cholesterol (Calc): 86 mg/dL (calc)
Non-HDL Cholesterol (Calc): 106 mg/dL (calc) (ref <130)
Total CHOL/HDL Ratio: 2.7 (calc) (ref <5.0)
Triglycerides: 104 mg/dL (ref <150)

## 2020-02-10 LAB — CBC WITH DIFFERENTIAL/PLATELET
Absolute Monocytes: 432 cells/uL (ref 200–950)
Basophils Absolute: 38 cells/uL (ref 0–200)
Basophils Relative: 0.7 %
Eosinophils Absolute: 221 cells/uL (ref 15–500)
Eosinophils Relative: 4.1 %
HCT: 41.2 % (ref 35.0–45.0)
Hemoglobin: 13.6 g/dL (ref 11.7–15.5)
Lymphs Abs: 1701 cells/uL (ref 850–3900)
MCH: 30.6 pg (ref 27.0–33.0)
MCHC: 33 g/dL (ref 32.0–36.0)
MCV: 92.8 fL (ref 80.0–100.0)
MPV: 10.8 fL (ref 7.5–12.5)
Monocytes Relative: 8 %
Neutro Abs: 3008 cells/uL (ref 1500–7800)
Neutrophils Relative %: 55.7 %
Platelets: 264 10*3/uL (ref 140–400)
RBC: 4.44 10*6/uL (ref 3.80–5.10)
RDW: 13.1 % (ref 11.0–15.0)
Total Lymphocyte: 31.5 %
WBC: 5.4 10*3/uL (ref 3.8–10.8)

## 2020-02-10 LAB — TSH: TSH: 1.68 mIU/L

## 2020-02-11 ENCOUNTER — Encounter: Payer: Self-pay | Admitting: Internal Medicine

## 2020-02-11 DIAGNOSIS — Z1231 Encounter for screening mammogram for malignant neoplasm of breast: Secondary | ICD-10-CM | POA: Diagnosis not present

## 2020-02-12 ENCOUNTER — Encounter: Payer: Self-pay | Admitting: Internal Medicine

## 2020-02-12 ENCOUNTER — Other Ambulatory Visit: Payer: Self-pay

## 2020-02-12 ENCOUNTER — Ambulatory Visit (INDEPENDENT_AMBULATORY_CARE_PROVIDER_SITE_OTHER): Payer: BC Managed Care – PPO | Admitting: Internal Medicine

## 2020-02-12 VITALS — BP 110/80 | HR 82 | Ht 61.0 in | Wt 186.0 lb

## 2020-02-12 DIAGNOSIS — I10 Essential (primary) hypertension: Secondary | ICD-10-CM | POA: Diagnosis not present

## 2020-02-12 DIAGNOSIS — Z23 Encounter for immunization: Secondary | ICD-10-CM | POA: Diagnosis not present

## 2020-02-12 DIAGNOSIS — E7800 Pure hypercholesterolemia, unspecified: Secondary | ICD-10-CM

## 2020-02-12 DIAGNOSIS — Z8616 Personal history of COVID-19: Secondary | ICD-10-CM

## 2020-02-12 DIAGNOSIS — R202 Paresthesia of skin: Secondary | ICD-10-CM

## 2020-02-12 DIAGNOSIS — R200 Anesthesia of skin: Secondary | ICD-10-CM

## 2020-02-12 DIAGNOSIS — Z6835 Body mass index (BMI) 35.0-35.9, adult: Secondary | ICD-10-CM | POA: Diagnosis not present

## 2020-02-12 DIAGNOSIS — Z0000 Encounter for general adult medical examination without abnormal findings: Secondary | ICD-10-CM

## 2020-02-12 LAB — POCT URINALYSIS DIPSTICK
Appearance: NEGATIVE
Bilirubin, UA: NEGATIVE
Blood, UA: NEGATIVE
Glucose, UA: NEGATIVE
Ketones, UA: NEGATIVE
Leukocytes, UA: NEGATIVE
Nitrite, UA: NEGATIVE
Odor: NEGATIVE
Protein, UA: NEGATIVE
Spec Grav, UA: 1.01 (ref 1.010–1.025)
Urobilinogen, UA: 0.2 E.U./dL
pH, UA: 6.5 (ref 5.0–8.0)

## 2020-02-12 NOTE — Progress Notes (Signed)
   Subjective:    Patient ID: [name], female    DOB: 1965-11-30, 54 y.o.   MRN: [id number]  HPI 54 year old Female for health maintenance exam and evaluation of medical issues.  Has issues with self control with her diet.  If high calorie foods are around, she will eat them.  Patient and entire family had Covid early August.  Had colonoscopy January 2020 at [facility] endoscopy.  She was seen July 2019 with complaint of palpitations and elevated blood pressure.  EKG at that time was normal without ectopy.  History of cervical cryotherapy as an outpatient in 1991.  Left lower lobe pneumonia 2004.  Basal cell carcinoma right forehead 2006.  Benign right breast biopsy and lipoma removed from right shoulder 1993.  History of migraine headaches.  Family history: Mother with history of coronary artery disease.  Father died of complications of leukemia.  Social history: Married with 4 adult children and 2 grandchildren.  Non-smoker.  Not getting a lot of physical exercise.  Looks after her mother who lives independently.  Review of Systems Entire right arm goes numb if raises arm- no injury. Sleeps on couch     Objective:   Physical Exam  Weight is 186 pounds.  Blood pressure 110/80 pulse 82 BMI 35.14 weight 186 pounds  Skin warm and dry.  Nodes none.  TMs are clear.  Neck is supple without thyromegaly or carotid bruits.  No adenopathy.  Chest is clear to auscultation.  Cardiac exam regular rate and rhythm normal S1 and S2.  Abdomen obese soft nondistended without hepatosplenomegaly masses or tenderness.  Pelvic exam: Pap taken in 2019.  Bimanual is normal.  No lower extremity pitting edema.  Neuro intact without gross focal deficits.  Judgment affect and thought processes are normal.      Assessment & Plan:  Essential hypertension-stable on current regimen  History of palpitations controlled with beta-blocker  BMI 35-continue diet and exercise efforts  History of  COVID-19  Plan: Have recommended Dr. Migdalia Dk clinic in the past.  She does not seem to be motivated.  Last year started her on Crestor 5 mg 3 times a week for pure hypercholesterolemia.  Lipids are now normal.  I think this is excellent given her family history of heart disease in her mother.  Numbness and tingling in right hand suspect carpal tunnel syndrome.  Can be referred for evaluation if so desires  Plan: Continue current medications and follow-up in 1 year.  Continue to work on diet and exercise efforts.  Flu vaccine given.  Tetanus immunization is up-to-date.

## 2020-02-14 DIAGNOSIS — Z20822 Contact with and (suspected) exposure to covid-19: Secondary | ICD-10-CM | POA: Diagnosis not present

## 2020-02-21 NOTE — Patient Instructions (Addendum)
We can refer you to neurology for nerve conduction studies for numbness and tingling in hand if you so desire.  It was a pleasure to see you today.  Continue current medications and follow-up in 1 year.  Continue with diet and exercise efforts.  Flu vaccine given today.  Continue Crestor for hyperlipidemia.  Continue antihypertensive medications as previously prescribed.

## 2020-03-02 ENCOUNTER — Other Ambulatory Visit: Payer: Self-pay | Admitting: Internal Medicine

## 2020-03-24 ENCOUNTER — Other Ambulatory Visit: Payer: Self-pay | Admitting: Internal Medicine

## 2020-04-12 ENCOUNTER — Other Ambulatory Visit: Payer: Self-pay | Admitting: Internal Medicine

## 2020-06-21 ENCOUNTER — Other Ambulatory Visit: Payer: Self-pay | Admitting: Internal Medicine

## 2020-08-16 ENCOUNTER — Telehealth: Payer: Self-pay | Admitting: Internal Medicine

## 2020-08-16 NOTE — Telephone Encounter (Signed)
My note mentions arm numbness but not back pain. She can be referred for arm numbness- suspected carpal tunnel syndrome.

## 2020-08-16 NOTE — Telephone Encounter (Signed)
[name] [phone]  [name] called to say is continuing to have back pain and arm numbness, she said you guys talked about it some in October when she was here, does she need to come in and talk about it more or can she be referred to neurology.

## 2020-08-16 NOTE — Telephone Encounter (Signed)
LVM to CB.

## 2020-08-17 NOTE — Telephone Encounter (Signed)
Kim CB and I have placed referral

## 2020-08-31 DIAGNOSIS — H16223 Keratoconjunctivitis sicca, not specified as Sjogren's, bilateral: Secondary | ICD-10-CM | POA: Diagnosis not present

## 2020-08-31 DIAGNOSIS — H16143 Punctate keratitis, bilateral: Secondary | ICD-10-CM | POA: Diagnosis not present

## 2020-09-20 ENCOUNTER — Other Ambulatory Visit: Payer: Self-pay | Admitting: Internal Medicine

## 2020-09-21 DIAGNOSIS — H16223 Keratoconjunctivitis sicca, not specified as Sjogren's, bilateral: Secondary | ICD-10-CM | POA: Diagnosis not present

## 2020-09-21 DIAGNOSIS — H16143 Punctate keratitis, bilateral: Secondary | ICD-10-CM | POA: Diagnosis not present

## 2020-11-03 ENCOUNTER — Ambulatory Visit: Payer: BC Managed Care – PPO | Admitting: Diagnostic Neuroimaging

## 2020-11-15 DIAGNOSIS — H04123 Dry eye syndrome of bilateral lacrimal glands: Secondary | ICD-10-CM | POA: Diagnosis not present

## 2020-11-15 DIAGNOSIS — H16223 Keratoconjunctivitis sicca, not specified as Sjogren's, bilateral: Secondary | ICD-10-CM | POA: Diagnosis not present

## 2020-12-19 ENCOUNTER — Other Ambulatory Visit: Payer: Self-pay | Admitting: Internal Medicine

## 2021-01-06 ENCOUNTER — Encounter: Payer: Self-pay | Admitting: Podiatry

## 2021-01-06 ENCOUNTER — Other Ambulatory Visit: Payer: Self-pay

## 2021-01-06 ENCOUNTER — Ambulatory Visit (INDEPENDENT_AMBULATORY_CARE_PROVIDER_SITE_OTHER): Payer: BC Managed Care – PPO | Admitting: Podiatry

## 2021-01-06 ENCOUNTER — Ambulatory Visit (INDEPENDENT_AMBULATORY_CARE_PROVIDER_SITE_OTHER): Payer: BC Managed Care – PPO

## 2021-01-06 DIAGNOSIS — M2041 Other hammer toe(s) (acquired), right foot: Secondary | ICD-10-CM

## 2021-01-06 DIAGNOSIS — Q828 Other specified congenital malformations of skin: Secondary | ICD-10-CM

## 2021-01-06 DIAGNOSIS — M7751 Other enthesopathy of right foot: Secondary | ICD-10-CM

## 2021-01-06 NOTE — Progress Notes (Signed)
Subjective:   Patient ID: [name], female   DOB: 55 y.o.   MRN: [id number]   HPI Patient presents stating she has a lot of pain on the top of her right foot for around a month with digits that seem to be moving up and has lesions on the bottom of both feet which has been chronic in nature and present for a number of years.  Patient does not smoke likes to be active   Review of Systems  All other systems reviewed and are negative.      Objective:  Physical Exam Vitals and nursing note reviewed.  Constitutional:      Appearance: She is well-developed.  Pulmonary:     Effort: Pulmonary effort is normal.  Musculoskeletal:        General: Normal range of motion.  Skin:    General: Skin is warm.  Neurological:     Mental Status: She is alert.    Neurovascular status found to be intact muscle strength found to be adequate range of motion adequate.  Patient is noted to have discomfort with fluid buildup around the second MPJ right with pain into the joint surface and is noted to have digital deformity of a moderate nature bilateral and has lesion formation with evidence of tight quarters bilateral.  Patient has good digital perfusion well oriented x3     Assessment:  Inflammatory capsulitis second MPJ right with the possibility for flexor plate dislocation for stretch along with chronic porokeratotic lesions     Plan:  H&P reviewed conditions.  At this point of focusing on this right foot and I did discuss that it is possible that we will get some further movement of the toe and ultimately may require surgery.  Today I did a forefoot block right 60 mg like Marcaine mixture I aspirated the second MPJ after sterile prep getting out a small amount of clear fluid injected quarter cc dexamethasone Kenalog and applied padding.  Discussed rigid bottom shoes will be seen back to recheck.  I then debrided lesions on both feet no iatrogenic bleeding reappoint routine care  X-ray indicates  that there is no signs currently of arthritis or fracture with moderate elevation of the lesser digits noted right

## 2021-01-20 ENCOUNTER — Other Ambulatory Visit: Payer: Self-pay

## 2021-01-20 ENCOUNTER — Ambulatory Visit (INDEPENDENT_AMBULATORY_CARE_PROVIDER_SITE_OTHER): Payer: BC Managed Care – PPO | Admitting: Podiatry

## 2021-01-20 ENCOUNTER — Encounter: Payer: Self-pay | Admitting: Podiatry

## 2021-01-20 DIAGNOSIS — M2041 Other hammer toe(s) (acquired), right foot: Secondary | ICD-10-CM

## 2021-01-20 DIAGNOSIS — M7751 Other enthesopathy of right foot: Secondary | ICD-10-CM

## 2021-01-20 DIAGNOSIS — Q828 Other specified congenital malformations of skin: Secondary | ICD-10-CM

## 2021-01-20 NOTE — Progress Notes (Signed)
  Subjective:  Patient ID: [name], female    DOB: December 05, 1965,   MRN: [id number]  Chief Complaint  Patient presents with   Follow-up    Follow up right foot pain.  Pt states pain is better than before but can still feel pain in her joints.     55 y.o. female presents for right foot pain. She was previously seen by Dr. Paulla Dolly and given an injection into second toe joint. States that pain has improved. Still having pain on the plantar foot where lesions are present. Also complains of worsening hammertoes.  Denies any other pedal complaints. Denies n/v/f/c.   Past Medical History:  Diagnosis Date   Migraine     Objective:  Physical Exam: Vascular: DP/PT pulses 2/4 bilateral. CFT <3 seconds. Normal hair growth on digits. No edema.  Skin. No lacerations or abrasions bilateral feet.  Musculoskeletal: MMT 5/5 bilateral lower extremities in DF, PF, Inversion and Eversion. Deceased ROM in DF of ankle joint. Hammered digits 2-4 bilateral feet. Tenderness to hyperkeratotic lesions right foot. Tenderness to right second MTP joint and tenderness with ROM.  Neurological: Sensation intact to light touch.   Assessment:   1. Capsulitis of metatarsophalangeal (MTP) joint of right foot   2. Porokeratosis   3. Hammertoe of right foot      Plan:  Patient was evaluated and treated and all questions answered. -Discussed porokeratosis and hammertoes with patient and treatment options.  -Hyperkeratotic tissue was debrided with chisel without incident.  -Applied salycylic acid treatment to area with dressing. Advised to remove bandaging tomorrow.  -Encouraged daily moisturizing -Discussed use of pumice stone -Advised good supportive shoes and inserts -Discussed hyperhidrosis and trying OTC treatments.  -Second toe joint is improved today.  -Patient to return to office as needed or sooner if condition worsens.   Lorenda Peck, DPM  .

## 2021-02-10 ENCOUNTER — Ambulatory Visit (INDEPENDENT_AMBULATORY_CARE_PROVIDER_SITE_OTHER): Payer: BC Managed Care – PPO | Admitting: Podiatry

## 2021-02-10 ENCOUNTER — Other Ambulatory Visit: Payer: Self-pay

## 2021-02-10 ENCOUNTER — Encounter: Payer: Self-pay | Admitting: Podiatry

## 2021-02-10 DIAGNOSIS — M2041 Other hammer toe(s) (acquired), right foot: Secondary | ICD-10-CM | POA: Diagnosis not present

## 2021-02-10 DIAGNOSIS — Q828 Other specified congenital malformations of skin: Secondary | ICD-10-CM

## 2021-02-10 DIAGNOSIS — M7751 Other enthesopathy of right foot: Secondary | ICD-10-CM | POA: Diagnosis not present

## 2021-02-10 MED ORDER — PREDNISONE 10 MG PO TABS: ORAL_TABLET | ORAL | 0 refills | Status: DC

## 2021-02-10 MED ORDER — DICLOFENAC SODIUM 75 MG PO TBEC: 75.0000 mg | DELAYED_RELEASE_TABLET | Freq: Two times a day (BID) | ORAL | 2 refills | Status: DC

## 2021-02-10 NOTE — Progress Notes (Signed)
Subjective:   Patient ID: [name], female   DOB: 55 y.o.   MRN: [id number]   HPI Patient presents stating she still having quite a bit of forefoot pain right and states the joint is still inflamed and lesions on both feet.  States that she had some relief of symptoms but still having problems with elevated digit which she feels like it is only occurred over the last few months   ROS      Objective:  Physical Exam  Neurovascular status intact with continued discomfort of the second metatarsal phalangeal joint right with elevated digit of a moderate nature and rigid contracture against the second MPJ with lesions consistent with lucent light core formation     Assessment:  Inflammatory capsulitis second MPJ right with digital deformity and probability for flexor plate stretch that may be acute in nature with porokeratotic lesions bilateral     Plan:  H&P reviewed condition recommended immobilization with boot with the possibility for further injection and ultimately digital fusion shortening osteotomy.  I educated her on the different alternatives that may be present I debrided lesions reappoint to recheck.  Air fracture walker dispensed all instructions given

## 2021-02-14 ENCOUNTER — Encounter: Payer: Self-pay | Admitting: Internal Medicine

## 2021-02-14 DIAGNOSIS — Z1231 Encounter for screening mammogram for malignant neoplasm of breast: Secondary | ICD-10-CM | POA: Diagnosis not present

## 2021-02-15 ENCOUNTER — Other Ambulatory Visit: Payer: BC Managed Care – PPO | Admitting: Internal Medicine

## 2021-02-15 ENCOUNTER — Other Ambulatory Visit: Payer: Self-pay

## 2021-02-15 DIAGNOSIS — I10 Essential (primary) hypertension: Secondary | ICD-10-CM

## 2021-02-15 DIAGNOSIS — Z0000 Encounter for general adult medical examination without abnormal findings: Secondary | ICD-10-CM

## 2021-02-15 DIAGNOSIS — E559 Vitamin D deficiency, unspecified: Secondary | ICD-10-CM | POA: Diagnosis not present

## 2021-02-15 DIAGNOSIS — E7800 Pure hypercholesterolemia, unspecified: Secondary | ICD-10-CM

## 2021-02-15 DIAGNOSIS — Z1329 Encounter for screening for other suspected endocrine disorder: Secondary | ICD-10-CM

## 2021-02-16 LAB — COMPLETE METABOLIC PANEL WITH GFR
AG Ratio: 2 (calc) (ref 1.0–2.5)
ALT: 24 U/L (ref 6–29)
AST: 26 U/L (ref 10–35)
Albumin: 4.4 g/dL (ref 3.6–5.1)
Alkaline phosphatase (APISO): 59 U/L (ref 37–153)
BUN: 15 mg/dL (ref 7–25)
CO2: 29 mmol/L (ref 20–32)
Calcium: 9.2 mg/dL (ref 8.6–10.4)
Chloride: 99 mmol/L (ref 98–110)
Creat: 0.85 mg/dL (ref 0.50–1.03)
Globulin: 2.2 g/dL (calc) (ref 1.9–3.7)
Glucose, Bld: 96 mg/dL (ref 65–99)
Potassium: 3.7 mmol/L (ref 3.5–5.3)
Sodium: 138 mmol/L (ref 135–146)
Total Bilirubin: 0.5 mg/dL (ref 0.2–1.2)
Total Protein: 6.6 g/dL (ref 6.1–8.1)
eGFR: 81 mL/min/{1.73_m2} (ref >=60)

## 2021-02-16 LAB — CBC WITH DIFFERENTIAL/PLATELET
Absolute Monocytes: 378 cells/uL (ref 200–950)
Basophils Absolute: 30 cells/uL (ref 0–200)
Basophils Relative: 0.5 %
Eosinophils Absolute: 159 cells/uL (ref 15–500)
Eosinophils Relative: 2.7 %
HCT: 40.6 % (ref 35.0–45.0)
Hemoglobin: 13.7 g/dL (ref 11.7–15.5)
Lymphs Abs: 1581 cells/uL (ref 850–3900)
MCH: 31.1 pg (ref 27.0–33.0)
MCHC: 33.7 g/dL (ref 32.0–36.0)
MCV: 92.1 fL (ref 80.0–100.0)
MPV: 10.8 fL (ref 7.5–12.5)
Monocytes Relative: 6.4 %
Neutro Abs: 3752 cells/uL (ref 1500–7800)
Neutrophils Relative %: 63.6 %
Platelets: 213 10*3/uL (ref 140–400)
RBC: 4.41 10*6/uL (ref 3.80–5.10)
RDW: 12 % (ref 11.0–15.0)
Total Lymphocyte: 26.8 %
WBC: 5.9 10*3/uL (ref 3.8–10.8)

## 2021-02-16 LAB — TSH: TSH: 3.02 mIU/L

## 2021-02-16 LAB — LIPID PANEL
Cholesterol: 166 mg/dL (ref <200)
HDL: 63 mg/dL (ref >=50)
LDL Cholesterol (Calc): 80 mg/dL (calc)
Non-HDL Cholesterol (Calc): 103 mg/dL (calc) (ref <130)
Total CHOL/HDL Ratio: 2.6 (calc) (ref <5.0)
Triglycerides: 134 mg/dL (ref <150)

## 2021-02-17 ENCOUNTER — Other Ambulatory Visit: Payer: Self-pay

## 2021-02-17 ENCOUNTER — Encounter: Payer: Self-pay | Admitting: Internal Medicine

## 2021-02-17 ENCOUNTER — Other Ambulatory Visit (HOSPITAL_COMMUNITY)
Admission: RE | Admit: 2021-02-17 | Discharge: 2021-02-17 | Disposition: A | Discharge status: Home / Self Care | Payer: BC Managed Care – PPO | Source: Ambulatory Visit | Attending: Internal Medicine | Admitting: Internal Medicine

## 2021-02-17 ENCOUNTER — Ambulatory Visit (INDEPENDENT_AMBULATORY_CARE_PROVIDER_SITE_OTHER): Payer: BC Managed Care – PPO | Admitting: Internal Medicine

## 2021-02-17 VITALS — BP 112/82 | HR 59 | Temp 98.3°F | Ht 60.0 in | Wt 193.0 lb

## 2021-02-17 DIAGNOSIS — Z8616 Personal history of COVID-19: Secondary | ICD-10-CM | POA: Diagnosis not present

## 2021-02-17 DIAGNOSIS — E7800 Pure hypercholesterolemia, unspecified: Secondary | ICD-10-CM

## 2021-02-17 DIAGNOSIS — Z6837 Body mass index (BMI) 37.0-37.9, adult: Secondary | ICD-10-CM

## 2021-02-17 DIAGNOSIS — Z124 Encounter for screening for malignant neoplasm of cervix: Secondary | ICD-10-CM | POA: Diagnosis not present

## 2021-02-17 DIAGNOSIS — Z23 Encounter for immunization: Secondary | ICD-10-CM | POA: Diagnosis not present

## 2021-02-17 DIAGNOSIS — I10 Essential (primary) hypertension: Secondary | ICD-10-CM | POA: Diagnosis not present

## 2021-02-17 DIAGNOSIS — Z0000 Encounter for general adult medical examination without abnormal findings: Secondary | ICD-10-CM | POA: Diagnosis not present

## 2021-02-17 LAB — POCT URINALYSIS DIPSTICK
Bilirubin, UA: NEGATIVE
Blood, UA: NEGATIVE
Glucose, UA: NEGATIVE
Ketones, UA: NEGATIVE
Leukocytes, UA: NEGATIVE
Nitrite, UA: NEGATIVE
Protein, UA: POSITIVE — AB
Spec Grav, UA: 1.01 (ref 1.010–1.025)
Urobilinogen, UA: 0.2 E.U./dL
pH, UA: 6.5 (ref 5.0–8.0)

## 2021-02-17 NOTE — Patient Instructions (Signed)
Diet and exercise discussed.  Continue antihypertensive medication.  Having issues with GE reflux with starting daily PPI.  Discussed weight loss clinic with Dr. Leafy Ro.  TSH at upper limits of normal.  Recheck TSH in 6 months with office visit.  Flu vaccine given.  COVID-vaccine declined.

## 2021-02-17 NOTE — Progress Notes (Signed)
   Subjective:    Patient ID: Autumn Messing, female    DOB: 08-17-1965, 55 y.o.   MRN: [id number]  HPI 55 year old Female for health maintenance exam and evaluation of medical issues. Had Covid in July and had it previously August 2021.  TSH elevated at 3.02 and will  repeat in 6 months.  Previously TSH has been in the 1-2 range.  Had colonoscopy January 2020 at [facility] endoscopy.  Weight  loss discussed. Says she will try dieting on her on.  Has 2 hammertoes right second and third toe.  She was seen in July 2019 with palpitations and elevated blood pressure.  EKG at that time was normal without ectopy.  History of cervical cryotherapy as an outpatient in 1991.  Left lower lobe pneumonia 2004.  Basal cell carcinoma right forehead 2006.  Benign right breast biopsy and lipoma removed from right shoulder 1993.  History of migraine headaches.  Social history: Married with 4 adult children and 2 grandchildren.  Non-smoker.  Not getting a lot of physical exercise.  Looks after her mother who lives independently.  Family history: Mother with history of coronary artery disease.  Father died with complications of leukemia.      Review of Systems Just had mammogram recently report pending. Will get flu vaccine today.     Objective:   Physical Exam VS reviewed. BMI 37.69 pulse 59 pulse ox 96% T. 98.3 Skin is warm and dry.  Nodes: None.  TMs are clear.  Her neck is supple without thyromegaly or carotid bruits.  No cervical adenopathy.  Chest is clear to auscultation.  Cardiac exam: Regular rate and rhythm, normal S1 and S2 without ectopy.  Abdomen: Obese, soft, nondistended without hepatosplenomegaly, masses or tenderness.  Pelvic exam: Bimanual is normal.  Pap taken today.  Neurological exam is intact without gross focal deficits.  Affect thought and judgment are within normal limits.      Assessment & Plan:  BMI 37.69-have discussed Dr. Migdalia Dk clinic in the past.  Hyperlipidemia  started on Crestor 3 times a week for pure hypercholesterolemia in 2020.  Now takes it daily and lipid panel is normal.  Hypertension treated with amlodipine, HCTZ and metoprolol and stable.  GE reflux treated with Protonix 40 mg daily.  Capsulitis of MTP joint right foot.  Has had steroid injection second toe joint and pain in plantar foot area.  Has hammertoes both feet.  Hammertoe surgery per Dr. Paulla Dolly planned for December.  History of palpitations controlled with beta-blocker.  Health maintenance-given flu vaccine.  Needs to consider COVID vaccines.  Tetanus immunization is up-to-date.  Had colonoscopy in 2020.  Had mammogram this month.  Recheck TSH in 6 months with office visit.

## 2021-02-22 LAB — CYTOLOGY - PAP
Comment: NEGATIVE
Diagnosis: UNDETERMINED — AB
High risk HPV: NEGATIVE

## 2021-03-03 ENCOUNTER — Other Ambulatory Visit: Payer: Self-pay | Admitting: Internal Medicine

## 2021-03-03 ENCOUNTER — Ambulatory Visit: Payer: BC Managed Care – PPO | Admitting: Podiatry

## 2021-03-10 ENCOUNTER — Encounter: Payer: Self-pay | Admitting: Podiatry

## 2021-03-10 ENCOUNTER — Ambulatory Visit (INDEPENDENT_AMBULATORY_CARE_PROVIDER_SITE_OTHER): Payer: BC Managed Care – PPO | Admitting: Podiatry

## 2021-03-10 ENCOUNTER — Other Ambulatory Visit: Payer: Self-pay

## 2021-03-10 DIAGNOSIS — M7751 Other enthesopathy of right foot: Secondary | ICD-10-CM

## 2021-03-10 DIAGNOSIS — M2041 Other hammer toe(s) (acquired), right foot: Secondary | ICD-10-CM

## 2021-03-10 NOTE — Progress Notes (Signed)
Subjective:   Patient ID: [name], female   DOB: 55 y.o.   MRN: [id number]   HPI Patient presents stating my right foot is not getting better the toes are lifting up further and I think I am getting need to have something done.  States she had trouble wearing the boot but tries and did take the prednisone which helped temporarily but has reoccurrence of pain and deformity   ROS      Objective:  Physical Exam  Neurovascular status intact with significant rigid contracture of digits 2 3 right with inflammation fluid of the second MPJ with the probability that there is been abnormal movement of the digits themselves      Assessment:  Probability for stretch or tear of the flexor plate right leading to dorsal dislocation digits 2 3 with inflammatory changes around the MPJ second chronic in nature      Plan:  H&P condition reviewed and discussed.  I do think that given the length of time its been hurting and the worsening of symptoms that digital fusion along with shortening osteotomy would be best and she wants this done and at this point I allowed her to read consent form for digital fusion digits 2 3 shortening osteotomy second and allowed her after extensive review to sign consent form.  Patient scheduled outpatient surgery and all questions were answered and she does understand total recovery.  Will be approximately 6 months no guarantee the toes will completely lay down and there will be stiffness at the inner phalangeal joint due to fusion.  She is willing to accept risk of surgery and understands all complications signed consent form and is scheduled for outpatient surgery encouraged to call questions concerns which may arise

## 2021-03-15 ENCOUNTER — Other Ambulatory Visit: Payer: Self-pay | Admitting: Internal Medicine

## 2021-03-15 ENCOUNTER — Telehealth: Payer: Self-pay | Admitting: Internal Medicine

## 2021-03-15 MED ORDER — PANTOPRAZOLE SODIUM 40 MG PO TBEC: 40.0000 mg | DELAYED_RELEASE_TABLET | Freq: Every day | ORAL | 1 refills | Status: DC

## 2021-03-15 NOTE — Telephone Encounter (Signed)
[name] [phone]  [name] called to say she wants to get something else for indigestion, she said you guys talked some about it in her last physical appointment.  Pepcid does not help very much, she would like something that last longer.  She also said it was time for refills on her medications, I ask her to have pharmacy to send them in electronically.

## 2021-03-15 NOTE — Telephone Encounter (Signed)
I sent in her other medications not sure what you were going to do about the acid reflux medication.

## 2021-03-15 NOTE — Addendum Note (Signed)
Addended by: Angus Seller on: 03/15/2021 04:47 PM   Modules accepted: Orders

## 2021-03-16 ENCOUNTER — Encounter: Payer: Self-pay | Admitting: Internal Medicine

## 2021-03-16 ENCOUNTER — Telehealth: Payer: Self-pay | Admitting: Internal Medicine

## 2021-03-16 NOTE — Telephone Encounter (Signed)
I have sent in Protonix 40 mg daily for her as of yesterday. OTC meds were not working for her. She is to try this for 30 days and see if it helps. Spoke with patient personally by phone today.  MJB,MD

## 2021-03-30 ENCOUNTER — Telehealth: Payer: Self-pay | Admitting: Urology

## 2021-03-30 NOTE — Telephone Encounter (Signed)
DOS - 04/19/21  METATARSAL OSTEOTOMY 2ND RIGHT --- 30104 HAMMERTOE REPAIR 2,3 RIGHT --- [id number]   [employer] EFFECTIVE DATE - 04/24/20   NO PRIOR AUTH REQUIRED

## 2021-04-18 MED ORDER — HYDROCODONE-ACETAMINOPHEN 10-325 MG PO TABS: 1.0000 | ORAL_TABLET | Freq: Three times a day (TID) | ORAL | 0 refills | Status: AC | PRN

## 2021-04-18 NOTE — Addendum Note (Signed)
Addended by: Wallene Huh on: 04/18/2021 10:06 PM   Modules accepted: Orders

## 2021-04-19 ENCOUNTER — Encounter: Payer: Self-pay | Admitting: Podiatry

## 2021-04-19 DIAGNOSIS — M21541 Acquired clubfoot, right foot: Secondary | ICD-10-CM

## 2021-04-19 DIAGNOSIS — M205X1 Other deformities of toe(s) (acquired), right foot: Secondary | ICD-10-CM | POA: Diagnosis not present

## 2021-04-19 DIAGNOSIS — M2041 Other hammer toe(s) (acquired), right foot: Secondary | ICD-10-CM | POA: Diagnosis not present

## 2021-04-19 HISTORY — PX: FOOT SURGERY: SHX648

## 2021-04-27 ENCOUNTER — Ambulatory Visit (INDEPENDENT_AMBULATORY_CARE_PROVIDER_SITE_OTHER): Payer: BC Managed Care – PPO

## 2021-04-27 ENCOUNTER — Other Ambulatory Visit: Payer: Self-pay

## 2021-04-27 ENCOUNTER — Encounter: Payer: Self-pay | Admitting: Podiatry

## 2021-04-27 ENCOUNTER — Ambulatory Visit (INDEPENDENT_AMBULATORY_CARE_PROVIDER_SITE_OTHER): Payer: BC Managed Care – PPO | Admitting: Podiatry

## 2021-04-27 DIAGNOSIS — M21541 Acquired clubfoot, right foot: Secondary | ICD-10-CM

## 2021-04-27 DIAGNOSIS — Z9889 Other specified postprocedural states: Secondary | ICD-10-CM

## 2021-04-28 NOTE — Progress Notes (Signed)
Subjective:   Patient ID: [name], female   DOB: 56 y.o.   MRN: [id number]   HPI Patient states doing well and presents with caregiver today for postoperative visit stating she is able to bear weight on her foot   ROS      Objective:  Physical Exam  Neurovascular status intact negative Bevelyn Buckles' sign noted wound edges coapted well digits in good alignment pins intact and mild edema consistent with 1 week postoperative     Assessment:  Doing well forefoot surgery right with good alignment good fixation noted     Plan:  H&P reviewed x-rays and then reapplied sterile dressing instructed on reappoint 2 weeks for suture removal and 4 weeks for pin removal.  Instructed on surgical shoe usage gradual increase in activities continue elevation compression immobilization and will call us with any issues which may arise prior to next visit  X-rays indicate osteotomy in good alignment fixation in place digits in good alignment fixation in place

## 2021-05-09 ENCOUNTER — Other Ambulatory Visit: Payer: Self-pay

## 2021-05-09 ENCOUNTER — Ambulatory Visit (INDEPENDENT_AMBULATORY_CARE_PROVIDER_SITE_OTHER): Payer: BC Managed Care – PPO

## 2021-05-09 DIAGNOSIS — Z9889 Other specified postprocedural states: Secondary | ICD-10-CM

## 2021-05-09 NOTE — Progress Notes (Signed)
Patient in office today for suture removal. Patient denies nausea, vomiting, fever and chills. Sutures removed from 3rd toe without complication. Encouaged patient to ease her way into the surgical shoe that was dispensed at last office visit from the Cam boot. Patient verbalized understand. Pin removal appointment has been scheduled.

## 2021-05-25 ENCOUNTER — Ambulatory Visit (INDEPENDENT_AMBULATORY_CARE_PROVIDER_SITE_OTHER): Payer: BC Managed Care – PPO

## 2021-05-25 ENCOUNTER — Ambulatory Visit (INDEPENDENT_AMBULATORY_CARE_PROVIDER_SITE_OTHER): Payer: BC Managed Care – PPO | Admitting: Podiatry

## 2021-05-25 ENCOUNTER — Other Ambulatory Visit: Payer: Self-pay

## 2021-05-25 DIAGNOSIS — M21541 Acquired clubfoot, right foot: Secondary | ICD-10-CM | POA: Diagnosis not present

## 2021-05-25 DIAGNOSIS — Z9889 Other specified postprocedural states: Secondary | ICD-10-CM

## 2021-05-25 NOTE — Progress Notes (Signed)
Subjective:   Patient ID: [name], female   DOB: 56 y.o.   MRN: [id number]   HPI Patient states overall is doing very well and is here for pin removal of the second and third toes right foot.  States overall the toes feel good   ROS      Objective:  Physical Exam  Neurovascular status intact negative Bevelyn Buckles' sign noted digits 2 3 right doing well wound edges well coapted pins intact both toes and crusted tissue noted dorsal     Assessment:  Overall doing well post digital fusion digits 2 3 right shortening of the second right mild elevation of the digits     Plan:  H&P pins removed sterile dressings applied x-ray reviewed and I applied fascial brace to both lower the toes and provide compression and immobilization.  She tolerated this well and we will lower the second and third digits and I advised her on gradual increase in activity return to normal shoe gear.  Reappoint 5 weeks  X-rays indicate that the digits are healing well good position screw in place second metatarsal good structure of the second third toes mild elevation

## 2021-06-29 ENCOUNTER — Encounter: Payer: Self-pay | Admitting: Podiatry

## 2021-06-29 ENCOUNTER — Ambulatory Visit (INDEPENDENT_AMBULATORY_CARE_PROVIDER_SITE_OTHER): Payer: BC Managed Care – PPO

## 2021-06-29 ENCOUNTER — Other Ambulatory Visit: Payer: Self-pay

## 2021-06-29 ENCOUNTER — Ambulatory Visit (INDEPENDENT_AMBULATORY_CARE_PROVIDER_SITE_OTHER): Payer: BC Managed Care – PPO | Admitting: Podiatry

## 2021-06-29 DIAGNOSIS — Z9889 Other specified postprocedural states: Secondary | ICD-10-CM | POA: Diagnosis not present

## 2021-06-29 NOTE — Progress Notes (Signed)
Subjective:  ? ?Patient ID: [name], female   DOB: 56 y.o.   MRN: [id number]  ? ?HPI ?Patient states she is doing well with only mild swelling still noted upon excessive activity ? ? ?ROS ? ? ?   ?Objective:  ?Physical Exam  ?Neurovascular status intact negative Bevelyn Buckles' sign noted wound edges coapted well toes good alignment and satisfactorily plantarflex currently with no pain doing well ? ?   ?Assessment:  ?Doing well post forefoot surgery right with good alignment incisions healed well mild edema ? ?   ?Plan:  ?H&P final x-rays taken today and patient is discharged and can return to normal activity.  Patient encouraged to call questions concerns ? ?X-rays indicate osteotomies healing well digits good alignment and not elevating at the current time ?   ? ? ?

## 2021-08-23 ENCOUNTER — Other Ambulatory Visit: Payer: BC Managed Care – PPO

## 2021-08-23 DIAGNOSIS — R7989 Other specified abnormal findings of blood chemistry: Secondary | ICD-10-CM

## 2021-08-23 DIAGNOSIS — E7800 Pure hypercholesterolemia, unspecified: Secondary | ICD-10-CM

## 2021-08-24 LAB — LIPID PANEL
Cholesterol: 162 mg/dL (ref <200)
HDL: 60 mg/dL (ref >=50)
LDL Cholesterol (Calc): 77 mg/dL (calc)
Non-HDL Cholesterol (Calc): 102 mg/dL (calc) (ref <130)
Total CHOL/HDL Ratio: 2.7 (calc) (ref <5.0)
Triglycerides: 148 mg/dL (ref <150)

## 2021-08-24 LAB — TSH: TSH: 2.11 mIU/L

## 2021-08-25 ENCOUNTER — Ambulatory Visit: Payer: BC Managed Care – PPO | Admitting: Internal Medicine

## 2021-09-01 ENCOUNTER — Ambulatory Visit (INDEPENDENT_AMBULATORY_CARE_PROVIDER_SITE_OTHER): Payer: BC Managed Care – PPO | Admitting: Internal Medicine

## 2021-09-01 ENCOUNTER — Encounter: Payer: Self-pay | Admitting: Internal Medicine

## 2021-09-01 VITALS — BP 112/82 | HR 64 | Temp 97.9°F | Ht 60.0 in | Wt 189.8 lb

## 2021-09-01 DIAGNOSIS — L230 Allergic contact dermatitis due to metals: Secondary | ICD-10-CM | POA: Diagnosis not present

## 2021-09-01 DIAGNOSIS — Z6837 Body mass index (BMI) 37.0-37.9, adult: Secondary | ICD-10-CM

## 2021-09-01 DIAGNOSIS — I10 Essential (primary) hypertension: Secondary | ICD-10-CM | POA: Diagnosis not present

## 2021-09-01 DIAGNOSIS — E782 Mixed hyperlipidemia: Secondary | ICD-10-CM

## 2021-09-01 DIAGNOSIS — R252 Cramp and spasm: Secondary | ICD-10-CM | POA: Diagnosis not present

## 2021-09-01 MED ORDER — TRIAMCINOLONE ACETONIDE 0.1 % EX CREA: 1.0000 "application " | TOPICAL_CREAM | Freq: Two times a day (BID) | CUTANEOUS | 0 refills | Status: DC

## 2021-09-01 NOTE — Progress Notes (Signed)
   Subjective:    Patient ID: [name], female    DOB: [birth year], 56 y.o.   MRN: [id number]  HPI 56 year old Female seen for 13-monthrecheck.  Had health maintenance exam here in October 2022.  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 RPaulla Dolly Had colonoscopy in 2020 and ankle endoscopy.  History of hypertension treated with amlodipine HCTZ and metoprolol.  Blood pressure is excellent at 112/82.  History of hyperlipidemia.  She is on low-dose statin therapy Crestor 5 mg daily.  She continues to struggle with her weight.  BMI 37.06.  Weight is 189 pounds 12 ounces.  History of palpitations in July 2019.  At that time blood pressure was elevated.  EKG was normal without ectopy.  History of migraine headaches.  History of cervical cryotherapy as an outpatient in 1991.  Left lower lobe pneumonia 2004.  Basal cell carcinoma removed from right forehead 2006.  Benign right breast biopsy and lipoma removed from right shoulder 1993.  Social history: Married with 4 adult children and 2 grandchildren.  Non-smoker.  Looks after her mother who lives independently.      Review of Systems see above     Objective:   Physical Exam  Blood pressure excellent 112/82 pulse 64 temperature 97.9 degrees pulse oximetry 97% weight 189 pounds 12 ounces BMI 37.86  Neck is supple without JVD thyromegaly or carotid bruits.  Chest is clear to auscultation.  Cardiac exam: Regular rate and rhythm without murmurs or ectopy.  No pitting edema of the lower extremities.      Assessment & Plan:  BMI 37.06-needs to consider dietary program or weight loss surgery.  Mother has history of heart disease.  Hyperlipidemia-currently on Crestor 5 mg daily and lipids are normal.  History of mixed hyperlipidemia.  Essential hypertension treated with metoprolol ,HCTZ ,and amlodipine-BP stable.  Potassium is normal on HCTZ.  History of allergic rhinitis treated  with Claritin  Family hx of heart disease. CT coronary calcium scoring ordered  TSH checked with this visit and is within normal limits.  TSH was elevated in October 2022 at 3.02.  Patient complaining of muscle pain and wants potassium checked. This was done and is WNL.

## 2021-09-02 LAB — POTASSIUM: Potassium: 4 mmol/L (ref 3.5–5.3)

## 2021-09-05 ENCOUNTER — Other Ambulatory Visit: Payer: Self-pay | Admitting: Internal Medicine

## 2021-09-07 ENCOUNTER — Other Ambulatory Visit: Payer: Self-pay | Admitting: Internal Medicine

## 2021-09-17 NOTE — Patient Instructions (Addendum)
Please consider a dietary program for weight loss or  consider weight loss surgery.  There is family history of heart disease in your mother.  Your blood pressure is under excellent control on current regimen.  No change in blood pressure medications.  Cramps.  Potassium checked today and is within normal limits.  Please continue Crestor for hyperlipidemia.  TSH is normal.  Return in 6 months for health maintenance exam. RTC in 6 months. CT coronary calcium scoring ordered.

## 2022-01-03 ENCOUNTER — Ambulatory Visit (HOSPITAL_BASED_OUTPATIENT_CLINIC_OR_DEPARTMENT_OTHER)
Admission: RE | Admit: 2022-01-03 | Discharge: 2022-01-03 | Disposition: A | Discharge status: Home / Self Care | Payer: BC Managed Care – PPO | Source: Ambulatory Visit | Attending: Internal Medicine | Admitting: Internal Medicine

## 2022-01-03 DIAGNOSIS — E782 Mixed hyperlipidemia: Secondary | ICD-10-CM | POA: Insufficient documentation

## 2022-01-04 DIAGNOSIS — M546 Pain in thoracic spine: Secondary | ICD-10-CM | POA: Diagnosis not present

## 2022-01-04 DIAGNOSIS — M542 Cervicalgia: Secondary | ICD-10-CM | POA: Diagnosis not present

## 2022-01-17 DIAGNOSIS — M542 Cervicalgia: Secondary | ICD-10-CM | POA: Diagnosis not present

## 2022-02-08 DIAGNOSIS — M542 Cervicalgia: Secondary | ICD-10-CM | POA: Diagnosis not present

## 2022-02-08 DIAGNOSIS — M546 Pain in thoracic spine: Secondary | ICD-10-CM | POA: Diagnosis not present

## 2022-02-08 DIAGNOSIS — M5412 Radiculopathy, cervical region: Secondary | ICD-10-CM | POA: Diagnosis not present

## 2022-02-14 DIAGNOSIS — M546 Pain in thoracic spine: Secondary | ICD-10-CM | POA: Diagnosis not present

## 2022-02-14 DIAGNOSIS — M5412 Radiculopathy, cervical region: Secondary | ICD-10-CM | POA: Diagnosis not present

## 2022-02-14 DIAGNOSIS — M542 Cervicalgia: Secondary | ICD-10-CM | POA: Diagnosis not present

## 2022-02-16 ENCOUNTER — Other Ambulatory Visit: Payer: BC Managed Care – PPO

## 2022-02-16 DIAGNOSIS — I10 Essential (primary) hypertension: Secondary | ICD-10-CM

## 2022-02-16 DIAGNOSIS — R5383 Other fatigue: Secondary | ICD-10-CM

## 2022-02-16 DIAGNOSIS — E782 Mixed hyperlipidemia: Secondary | ICD-10-CM

## 2022-02-16 DIAGNOSIS — E7800 Pure hypercholesterolemia, unspecified: Secondary | ICD-10-CM | POA: Diagnosis not present

## 2022-02-16 DIAGNOSIS — M546 Pain in thoracic spine: Secondary | ICD-10-CM | POA: Diagnosis not present

## 2022-02-16 DIAGNOSIS — M542 Cervicalgia: Secondary | ICD-10-CM | POA: Diagnosis not present

## 2022-02-17 LAB — CBC WITH DIFFERENTIAL/PLATELET
Absolute Monocytes: 370 cells/uL (ref 200–950)
Basophils Absolute: 50 cells/uL (ref 0–200)
Basophils Relative: 0.9 %
Eosinophils Absolute: 190 cells/uL (ref 15–500)
Eosinophils Relative: 3.4 %
HCT: 39.9 % (ref 35.0–45.0)
Hemoglobin: 13.9 g/dL (ref 11.7–15.5)
Lymphs Abs: 1574 cells/uL (ref 850–3900)
MCH: 30.8 pg (ref 27.0–33.0)
MCHC: 34.8 g/dL (ref 32.0–36.0)
MCV: 88.5 fL (ref 80.0–100.0)
MPV: 11 fL (ref 7.5–12.5)
Monocytes Relative: 6.6 %
Neutro Abs: 3416 cells/uL (ref 1500–7800)
Neutrophils Relative %: 61 %
Platelets: 249 10*3/uL (ref 140–400)
RBC: 4.51 10*6/uL (ref 3.80–5.10)
RDW: 12.4 % (ref 11.0–15.0)
Total Lymphocyte: 28.1 %
WBC: 5.6 10*3/uL (ref 3.8–10.8)

## 2022-02-17 LAB — LIPID PANEL
Cholesterol: 182 mg/dL (ref <200)
HDL: 67 mg/dL (ref >=50)
LDL Cholesterol (Calc): 92 mg/dL (calc)
Non-HDL Cholesterol (Calc): 115 mg/dL (calc) (ref <130)
Total CHOL/HDL Ratio: 2.7 (calc) (ref <5.0)
Triglycerides: 134 mg/dL (ref <150)

## 2022-02-17 LAB — COMPLETE METABOLIC PANEL WITH GFR
AG Ratio: 2.4 (calc) (ref 1.0–2.5)
ALT: 27 U/L (ref 6–29)
AST: 26 U/L (ref 10–35)
Albumin: 4.7 g/dL (ref 3.6–5.1)
Alkaline phosphatase (APISO): 67 U/L (ref 37–153)
BUN: 15 mg/dL (ref 7–25)
CO2: 30 mmol/L (ref 20–32)
Calcium: 9.3 mg/dL (ref 8.6–10.4)
Chloride: 101 mmol/L (ref 98–110)
Creat: 0.89 mg/dL (ref 0.50–1.03)
Globulin: 2 g/dL (calc) (ref 1.9–3.7)
Glucose, Bld: 99 mg/dL (ref 65–139)
Potassium: 3.8 mmol/L (ref 3.5–5.3)
Sodium: 140 mmol/L (ref 135–146)
Total Bilirubin: 0.4 mg/dL (ref 0.2–1.2)
Total Protein: 6.7 g/dL (ref 6.1–8.1)
eGFR: 77 mL/min/{1.73_m2} (ref >=60)

## 2022-02-17 LAB — TSH: TSH: 2.2 mIU/L

## 2022-02-20 ENCOUNTER — Encounter: Payer: Self-pay | Admitting: Internal Medicine

## 2022-02-20 DIAGNOSIS — M546 Pain in thoracic spine: Secondary | ICD-10-CM | POA: Diagnosis not present

## 2022-02-20 DIAGNOSIS — M542 Cervicalgia: Secondary | ICD-10-CM | POA: Diagnosis not present

## 2022-02-20 DIAGNOSIS — Z1231 Encounter for screening mammogram for malignant neoplasm of breast: Secondary | ICD-10-CM | POA: Diagnosis not present

## 2022-02-20 LAB — HM MAMMOGRAPHY

## 2022-02-22 ENCOUNTER — Encounter: Payer: Self-pay | Admitting: Internal Medicine

## 2022-02-22 ENCOUNTER — Ambulatory Visit (INDEPENDENT_AMBULATORY_CARE_PROVIDER_SITE_OTHER): Payer: BC Managed Care – PPO | Admitting: Internal Medicine

## 2022-02-22 VITALS — BP 124/80 | HR 61 | Temp 97.7°F | Ht 61.0 in | Wt 200.8 lb

## 2022-02-22 DIAGNOSIS — Z8279 Family history of other congenital malformations, deformations and chromosomal abnormalities: Secondary | ICD-10-CM | POA: Diagnosis not present

## 2022-02-22 DIAGNOSIS — M50122 Cervical disc disorder at C5-C6 level with radiculopathy: Secondary | ICD-10-CM | POA: Diagnosis not present

## 2022-02-22 DIAGNOSIS — Z8249 Family history of ischemic heart disease and other diseases of the circulatory system: Secondary | ICD-10-CM | POA: Diagnosis not present

## 2022-02-22 DIAGNOSIS — Z7185 Encounter for immunization safety counseling: Secondary | ICD-10-CM | POA: Diagnosis not present

## 2022-02-22 DIAGNOSIS — M5090 Cervical disc disorder, unspecified, unspecified cervical region: Secondary | ICD-10-CM

## 2022-02-22 DIAGNOSIS — Z6837 Body mass index (BMI) 37.0-37.9, adult: Secondary | ICD-10-CM | POA: Diagnosis not present

## 2022-02-22 DIAGNOSIS — Z0000 Encounter for general adult medical examination without abnormal findings: Secondary | ICD-10-CM

## 2022-02-22 DIAGNOSIS — M5412 Radiculopathy, cervical region: Secondary | ICD-10-CM | POA: Diagnosis not present

## 2022-02-22 DIAGNOSIS — I10 Essential (primary) hypertension: Secondary | ICD-10-CM

## 2022-02-22 DIAGNOSIS — Z23 Encounter for immunization: Secondary | ICD-10-CM

## 2022-02-22 LAB — POCT URINALYSIS DIPSTICK
Bilirubin, UA: NEGATIVE
Blood, UA: NEGATIVE
Glucose, UA: NEGATIVE
Ketones, UA: NEGATIVE
Leukocytes, UA: NEGATIVE
Nitrite, UA: NEGATIVE
Protein, UA: NEGATIVE
Spec Grav, UA: 1.01 (ref 1.010–1.025)
Urobilinogen, UA: 0.2 E.U./dL
pH, UA: 7.5 (ref 5.0–8.0)

## 2022-02-22 NOTE — Progress Notes (Signed)
   Subjective:    Patient ID: [name], female    DOB: 11-Oct-1965, 56 y.o.   MRN: [id number]  HPI 56 year old Female seen for health maintenance exam and evaluation of medical issues.  Going to PT for her neck. Has been seen at [facility] by Dr. Rolena Infante. Treated initially with  5 mg Prednisone dose pack and Gabapentin 300 mg at bedtime. Subsequently prescribed Mobic. She feels she is getting better.  Review of Dr. 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  Had CT coronary calcium scoring and is September and score was excellent at 2.75.  There is family history of heart disease in her mother.  I started patient on rosuvastatin previously and will continue with 5 mg daily.  She also has hypertension treated with HCTZ and metoprolol.  History of palpitations in July 2019.  At that time blood pressure was elevated.  EKG was normal without ectopy.  History of migraine headaches.  History of cervical cryotherapy as an outpatient in 1991 left lower lobe pneumonia 2004.  Basal cell carcinoma removed from right forehead in 2006.  Benign right breast biopsy and lipoma removed from right shoulder 1993.  Social history: Married with 4 adult children and 2 grandchildren.  Non-smoker.  Looks after her mother who lives independently.  History of GE reflux treated with Prevacid.  Colonoscopy due again in 2030.  Vaccines discussed and she will take flu vaccine today.  FamilyHx: Mother has macular degeneration and no longer drives.  Review of Systems-see above regarding neck pain and treatment.  Continues to struggle with her weight.  No significant change in her weight over the past year.  We have discussed options for weight loss including [facility] or [facility] or surgical consultation regarding gastric bypass surgery.     Objective:   Physical Exam Blood pressure 124/80 pulse 61  pulse oximetry 95% weight 200 pounds 12.8 ounces height 5 feet 1 inches BMI 37.94 Skin: Warm and dry.  No cervical adenopathy.  No thyromegaly or carotid bruits.  Chest clear.  Cardiac exam: Regular rate and rhythm without murmur or ectopy.  Abdomen obese soft nondistended without hepatosplenomegaly masses or tenderness.  No lower extremity pitting edema.  Brief neurological exam is intact without gross focal deficits.      Assessment & Plan:   History of mixed hyperlipidemia-lipids are now normal on rosuvastatin 5 mg daily and she had a good coronary calcium score.  BMI 37 which is worrisome.  Mother has a history of obesity.  Discussion regarding weight loss clinic versus gastric bypass surgery.  I am not sure patient is interested in either.  Essential hypertension treated with metoprolol and HCTZ  Family history of heart disease in mother-coronary calcium score was quite good at 2.75.  I still recommend statin medication.  Plan: Vaccines discussed.  Tetanus update due in 2024.  Received flu vaccine 02/22/2022.  Does not want COVID booster.  Return in 1 year or as needed.

## 2022-02-23 ENCOUNTER — Encounter: Payer: BC Managed Care – PPO | Admitting: Internal Medicine

## 2022-02-28 DIAGNOSIS — M5412 Radiculopathy, cervical region: Secondary | ICD-10-CM | POA: Diagnosis not present

## 2022-03-02 DIAGNOSIS — M5412 Radiculopathy, cervical region: Secondary | ICD-10-CM | POA: Diagnosis not present

## 2022-03-05 DIAGNOSIS — Z8279 Family history of other congenital malformations, deformations and chromosomal abnormalities: Secondary | ICD-10-CM | POA: Insufficient documentation

## 2022-03-05 DIAGNOSIS — I10 Essential (primary) hypertension: Secondary | ICD-10-CM | POA: Insufficient documentation

## 2022-03-05 DIAGNOSIS — Z6837 Body mass index (BMI) 37.0-37.9, adult: Secondary | ICD-10-CM | POA: Insufficient documentation

## 2022-03-05 DIAGNOSIS — Z8249 Family history of ischemic heart disease and other diseases of the circulatory system: Secondary | ICD-10-CM | POA: Insufficient documentation

## 2022-03-05 DIAGNOSIS — M5090 Cervical disc disorder, unspecified, unspecified cervical region: Secondary | ICD-10-CM | POA: Insufficient documentation

## 2022-03-06 ENCOUNTER — Other Ambulatory Visit: Payer: Self-pay

## 2022-03-06 MED ORDER — HYDROCHLOROTHIAZIDE 25 MG PO TABS: ORAL_TABLET | ORAL | 1 refills | Status: DC

## 2022-03-06 MED ORDER — AMLODIPINE BESYLATE 5 MG PO TABS: ORAL_TABLET | ORAL | 1 refills | Status: DC

## 2022-03-06 MED ORDER — METOPROLOL TARTRATE 25 MG PO TABS: 25.0000 mg | ORAL_TABLET | Freq: Every day | ORAL | 1 refills | Status: DC

## 2022-03-07 DIAGNOSIS — M545 Low back pain, unspecified: Secondary | ICD-10-CM | POA: Diagnosis not present

## 2022-03-08 ENCOUNTER — Telehealth: Payer: Self-pay | Admitting: Internal Medicine

## 2022-03-08 NOTE — Telephone Encounter (Signed)
Received Fax RX request from  [id number] - [place], [place] - [address] Phone: [phone]  Fax: [phone]      Medication - rosuvastatin (CRESTOR) 5 MG tablet   Last Refill - 12/12/2021  Last OV - 02/22/2022  Last CPE - 02/22/2022  Next Appointment - 02/26/2023

## 2022-03-09 MED ORDER — ROSUVASTATIN CALCIUM 5 MG PO TABS: ORAL_TABLET | ORAL | 3 refills | Status: DC

## 2022-03-21 DIAGNOSIS — M5451 Vertebrogenic low back pain: Secondary | ICD-10-CM | POA: Diagnosis not present

## 2022-07-20 ENCOUNTER — Encounter (HOSPITAL_BASED_OUTPATIENT_CLINIC_OR_DEPARTMENT_OTHER): Payer: Self-pay | Admitting: Emergency Medicine

## 2022-07-20 ENCOUNTER — Emergency Department (HOSPITAL_BASED_OUTPATIENT_CLINIC_OR_DEPARTMENT_OTHER): Payer: No Typology Code available for payment source

## 2022-07-20 ENCOUNTER — Inpatient Hospital Stay (HOSPITAL_BASED_OUTPATIENT_CLINIC_OR_DEPARTMENT_OTHER)
Admission: EM | Admit: 2022-07-20 | Discharge: 2022-07-21 | DRG: 313 | Disposition: A | Discharge status: Home / Self Care | Payer: No Typology Code available for payment source | Attending: Cardiovascular Disease | Admitting: Cardiovascular Disease

## 2022-07-20 ENCOUNTER — Other Ambulatory Visit: Payer: Self-pay

## 2022-07-20 ENCOUNTER — Telehealth: Payer: Self-pay | Admitting: Internal Medicine

## 2022-07-20 DIAGNOSIS — Z809 Family history of malignant neoplasm, unspecified: Secondary | ICD-10-CM | POA: Diagnosis not present

## 2022-07-20 DIAGNOSIS — E782 Mixed hyperlipidemia: Secondary | ICD-10-CM | POA: Diagnosis present

## 2022-07-20 DIAGNOSIS — R0789 Other chest pain: Secondary | ICD-10-CM | POA: Diagnosis present

## 2022-07-20 DIAGNOSIS — Z79899 Other long term (current) drug therapy: Secondary | ICD-10-CM | POA: Diagnosis not present

## 2022-07-20 DIAGNOSIS — R079 Chest pain, unspecified: Secondary | ICD-10-CM | POA: Diagnosis present

## 2022-07-20 DIAGNOSIS — Z7982 Long term (current) use of aspirin: Secondary | ICD-10-CM

## 2022-07-20 DIAGNOSIS — R072 Precordial pain: Secondary | ICD-10-CM | POA: Diagnosis not present

## 2022-07-20 DIAGNOSIS — I10 Essential (primary) hypertension: Secondary | ICD-10-CM | POA: Diagnosis present

## 2022-07-20 DIAGNOSIS — Z8249 Family history of ischemic heart disease and other diseases of the circulatory system: Secondary | ICD-10-CM | POA: Diagnosis not present

## 2022-07-20 DIAGNOSIS — Z881 Allergy status to other antibiotic agents status: Secondary | ICD-10-CM | POA: Diagnosis not present

## 2022-07-20 DIAGNOSIS — G4733 Obstructive sleep apnea (adult) (pediatric): Secondary | ICD-10-CM | POA: Diagnosis present

## 2022-07-20 DIAGNOSIS — G43909 Migraine, unspecified, not intractable, without status migrainosus: Secondary | ICD-10-CM | POA: Diagnosis present

## 2022-07-20 DIAGNOSIS — Z888 Allergy status to other drugs, medicaments and biological substances status: Secondary | ICD-10-CM

## 2022-07-20 DIAGNOSIS — I200 Unstable angina: Principal | ICD-10-CM | POA: Diagnosis present

## 2022-07-20 LAB — BASIC METABOLIC PANEL
Anion gap: 10 (ref 5–15)
BUN: 16 mg/dL (ref 6–20)
CO2: 28 mmol/L (ref 22–32)
Calcium: 9.8 mg/dL (ref 8.9–10.3)
Chloride: 101 mmol/L (ref 98–111)
Creatinine, Ser: 0.96 mg/dL (ref 0.44–1.00)
GFR, Estimated: >60 mL/min (ref >60)
Glucose, Bld: 99 mg/dL (ref 70–99)
Potassium: 3.8 mmol/L (ref 3.5–5.1)
Sodium: 139 mmol/L (ref 135–145)

## 2022-07-20 LAB — CBC
HCT: 39.3 % (ref 36.0–46.0)
Hemoglobin: 13.6 g/dL (ref 12.0–15.0)
MCH: 30.5 pg (ref 26.0–34.0)
MCHC: 34.6 g/dL (ref 30.0–36.0)
MCV: 88.1 fL (ref 80.0–100.0)
Platelets: 227 10*3/uL (ref 150–400)
RBC: 4.46 MIL/uL (ref 3.87–5.11)
RDW: 12.2 % (ref 11.5–15.5)
WBC: 5.5 10*3/uL (ref 4.0–10.5)
nRBC: 0 % (ref 0.0–0.2)

## 2022-07-20 LAB — TROPONIN I (HIGH SENSITIVITY)
Troponin I (High Sensitivity): 2 ng/L (ref <18)
Troponin I (High Sensitivity): 2 ng/L (ref <18)

## 2022-07-20 LAB — PREGNANCY, URINE: Preg Test, Ur: NEGATIVE

## 2022-07-20 MED ORDER — ASPIRIN 81 MG PO TBEC
81.0000 mg | DELAYED_RELEASE_TABLET | Freq: Every day | ORAL | Status: DC
  Administered 2022-07-21: 81 mg via ORAL
  Filled 2022-07-20: qty 1

## 2022-07-20 MED ORDER — METOPROLOL TARTRATE 25 MG PO TABS
25.0000 mg | ORAL_TABLET | Freq: Every day | ORAL | Status: DC
  Administered 2022-07-21: 25 mg via ORAL
  Filled 2022-07-20: qty 1

## 2022-07-20 MED ORDER — ACETAMINOPHEN 325 MG PO TABS: 650.0000 mg | ORAL_TABLET | ORAL | Status: DC | PRN

## 2022-07-20 MED ORDER — PANTOPRAZOLE SODIUM 40 MG PO TBEC
40.0000 mg | DELAYED_RELEASE_TABLET | Freq: Every day | ORAL | Status: DC
  Administered 2022-07-21: 40 mg via ORAL
  Filled 2022-07-20: qty 1

## 2022-07-20 MED ORDER — NITROGLYCERIN 0.4 MG SL SUBL: 0.4000 mg | SUBLINGUAL_TABLET | SUBLINGUAL | Status: DC | PRN

## 2022-07-20 MED ORDER — AMLODIPINE BESYLATE 5 MG PO TABS
5.0000 mg | ORAL_TABLET | Freq: Every day | ORAL | Status: DC
  Administered 2022-07-21: 5 mg via ORAL
  Filled 2022-07-20: qty 1

## 2022-07-20 MED ORDER — ROSUVASTATIN CALCIUM 5 MG PO TABS
5.0000 mg | ORAL_TABLET | Freq: Every day | ORAL | Status: DC
  Administered 2022-07-20: 5 mg via ORAL
  Filled 2022-07-20: qty 1

## 2022-07-20 MED ORDER — ENOXAPARIN SODIUM 40 MG/0.4ML IJ SOSY
40.0000 mg | PREFILLED_SYRINGE | INTRAMUSCULAR | Status: DC
  Administered 2022-07-21: 40 mg via SUBCUTANEOUS
  Filled 2022-07-20: qty 0.4

## 2022-07-20 MED ORDER — ONDANSETRON HCL 4 MG/2ML IJ SOLN: 4.0000 mg | Freq: Four times a day (QID) | INTRAMUSCULAR | Status: DC | PRN

## 2022-07-20 MED ORDER — HYDROCHLOROTHIAZIDE 25 MG PO TABS
25.0000 mg | ORAL_TABLET | Freq: Every day | ORAL | Status: DC
  Administered 2022-07-21: 25 mg via ORAL
  Filled 2022-07-20: qty 1

## 2022-07-20 NOTE — TOC Initial Note (Signed)
Transition of Care [facility]) - Initial/Assessment Note    Patient Details  Name: [name] MRN: [id number] Date of Birth: 03-17-1966  Transition of Care [facility]) CM/SW Contact:    Verdell Carmine, RN Phone Number: 07/20/2022, 5:21 PM  Clinical Narrative:                  The Transition of Care Department [facility]) has reviewed patient and no TOC needs have been identified at this time. We will continue to monitor patient advancement through interdisciplinary progression rounds. If new patient transition needs arise, please place a TOC consult       Patient Goals and CMS Choice            Expected Discharge Plan and Services                                              Prior Living Arrangements/Services                       Activities of Daily Living      Permission Sought/Granted                  Emotional Assessment              Admission diagnosis:  Unstable angina [I20.0] Patient Active Problem List   Diagnosis Date Noted   Unstable angina ([place]) 07/20/2022   BMI 37.0-37.9, adult 03/05/2022   Cervical disc disease 03/05/2022   Family history of coronary artery disease in mother 03/05/2022   Essential hypertension 03/05/2022   Hyperlipidemia 05/10/2012   Obesity 05/10/2012   GE reflux 02/14/2011   PCP:  Elby Showers, MD Pharmacy:   [facility] [phone] - [place], [address] [place] [address] Phone: [phone] Fax: [phone]     Social Determinants of Health (SDOH) Social History: SDOH Screenings   Depression (PHQ2-9): Low Risk  (02/22/2022)  Tobacco Use: Low Risk  (07/20/2022)   SDOH Interventions:     Readmission Risk Interventions     No data to display

## 2022-07-20 NOTE — ED Notes (Addendum)
[name] at [facility] will send transport as soon as available for the Bed ready at [facility] Room#18.-ABB(NS)

## 2022-07-20 NOTE — ED Triage Notes (Signed)
Pt arrives to ED with c/o chest pain. She reports that pain as heaviness and something sitting on her chest. She has had three episodes this week, first one on. The pain typically last several hours. SOB when walking up stairs. CP is central chest.

## 2022-07-20 NOTE — Telephone Encounter (Signed)
Called [name] back and explained to her per Dr Renold Genta with these symptoms it was best if she go to ED to be evaluated. She verbalized understanding

## 2022-07-20 NOTE — H&P (Signed)
Cardiology Admission History and Physical   Patient ID: [name] MRN: [id number]; DOB: 1965-06-16   Admission date: 07/20/2022  PCP:  Casey Showers, MD   [facility] Cardiologist:  None        Chief Complaint:  chest pain  Patient Profile:   [name] is a 57 y.o. female with hypertension and migraine headaches who is being seen 07/20/2022 for the evaluation of chest pain.  History of Present Illness:   [name] reports that she was in her usual state of health until about 3 days ago when she first developed chest discomfort.  She describes the chest discomfort as waking her up with chest tightness around her sternum that lasted about 1 hour before resolving spontaneously.  She proceeded to have an additional episode later the next day in the evening with similar symptoms, however symptoms lasted longer.  On day of presentation, she had an additional episode in the morning that lasted for multiple hours, though less severe and prompted her to present to the emergency room.  She denies associated dyspnea at rest or on exertion, changes in exertional tolerance, palpitations, dizziness/lightheadedness.  She states that she thought that her symptoms were related to abdominal spasms that she has had in the past, however her chest discomfort symptoms were higher in location than her previous spasms that she is experienced.  He has no known personal history of cardiac disease.  She states that her mother had an MI in her early 73s and required multiple stents.  CV home medications include amlodipine, hydrochlorothiazide and metoprolol which she takes on a regular basis.  Also takes aspirin and rosuvastatin.  She has undergone a coronary calcium score in 12/2021 with a score of 2.75 with calcifications only noted in the LAD (77th percentile).  No previous echo.  On presentation to the emergency room, she was noted to be hemodynamically stable with a normal  heart rate and normotensive on room air.  Her BMP and CBC were completely unremarkable.  High-sensitivity troponin 2-2.  Her EKG is without ischemic EKG changes including T wave inversions or ST segment changes.  Chest x-ray is unremarkable.  LDL: Last in 01/2022 with LDL 92 A1c: none recent TSH: Last in 01/2022 with normal TSH Echo: None previous   Past Medical History:  Diagnosis Date   Migraine     Past Surgical History:  Procedure Laterality Date   FOOT SURGERY Right 04/19/2021   LIPOMA EXCISION       Medications Prior to Admission: Prior to Admission medications   Medication Sig Start Date End Date Taking? Authorizing Provider  acetaminophen (TYLENOL) 500 MG tablet Take 1,000 mg by mouth every 6 (six) hours as needed for moderate pain.   Yes [provider]  amLODipine (NORVASC) 5 MG tablet TAKE 1 TABLET(5 MG) BY MOUTH EVERY MORNING 03/06/22  Yes Baxley, Cresenciano Lick, MD  aspirin 81 MG tablet Take 81 mg by mouth as needed.    Yes [provider]  b complex vitamins capsule Take 1 capsule by mouth as needed.   Yes [provider]  cholecalciferol (VITAMIN D) 1000 units tablet Take 1,000 Units by mouth daily.   Yes [provider]  hydrochlorothiazide (HYDRODIURIL) 25 MG tablet TAKE 1 TABLET(25 MG) BY MOUTH DAILY 03/06/22  Yes Baxley, Cresenciano Lick, MD  ibuprofen (ADVIL,MOTRIN) 200 MG tablet Take 200 mg by mouth every 6 (six) hours as needed for moderate pain.    Yes [provider]  lansoprazole (PREVACID) 30 MG capsule Take 30 mg by mouth 2 (two) times daily before a meal.   Yes [provider]  loratadine (CLARITIN) 10 MG tablet Take 10 mg by mouth daily.   Yes [provider]  metoprolol tartrate (LOPRESSOR) 25 MG tablet Take 1 tablet (25 mg total) by mouth daily. 03/06/22  Yes Baxley, Cresenciano Lick, MD  naproxen sodium (ANAPROX) 220 MG tablet Take 440 mg by mouth 2 (two) times daily as needed (pain).   Yes [provider]   Probiotic Product (PROBIOTIC-10 PO) Take by mouth.   Yes [provider]  rosuvastatin (CRESTOR) 5 MG tablet TAKE 1 TABLET(5 MG) BY MOUTH DAILY 03/09/22  Yes Baxley, Cresenciano Lick, MD  Vitamin C-Vitamin D-Zinc (D3/VITAMIN C/ZINC PO) Take by mouth daily.   Yes [provider]  triamcinolone cream (KENALOG) 0.1 % Apply 1 application. topically 2 (two) times daily. Patient not taking: Reported on 07/20/2022 09/01/21   Casey Showers, MD     Allergies:    Allergies  Allergen Reactions   Cephalexin Nausea And Vomiting   Pantoprazole Other (See Comments)    Headache    Social History:   Social History   Socioeconomic History   Marital status: Married    Spouse name: Not on file   Number of children: Not on file   Years of education: Not on file   Highest education level: Not on file  Occupational History   Not on file  Tobacco Use   Smoking status: Never   Smokeless tobacco: Never  Substance and Sexual Activity   Alcohol use: Yes    Comment: rarely   Drug use: No   Sexual activity: Not on file  Other Topics Concern   Not on file  Social History Narrative   Not on file   Social Determinants of Health   Financial Resource Strain: Not on file  Food Insecurity: Not on file  Transportation Needs: Not on file  Physical Activity: Not on file  Stress: Not on file  Social Connections: Not on file  Intimate Partner Violence: Not on file    Family History:   The patient's family history includes Cancer in her father; Heart disease in her mother.    ROS:  Please see the history of present illness.  All other ROS reviewed and negative.     Physical Exam/Data:   Vitals:   07/20/22 1600 07/20/22 1700 07/20/22 1900 07/20/22 1945  BP: 118/63 (!) 121/90 120/75   Pulse: (!) 53 68  62  Resp: 16 12 14 10   Temp: 98.2 F (36.8 C)     TempSrc: Oral     SpO2: 96% 100%  98%  Weight:      Height:       No intake or output data in the 24 hours ending 07/20/22 2012     07/20/2022   11:49 AM 02/22/2022   11:12 AM 09/01/2021    9:39 AM  Last 3 Weights  Weight (lbs) 190 lb 200 lb 12.8 oz 189 lb 12 oz  Weight (kg) 86.183 kg 91.082 kg 86.07 kg     Body mass index is 33.66 kg/m.  General:  Well nourished, well developed, in no acute distress HEENT: normal Neck: no JVD Vascular: Distal pulses 2+ bilaterally   Cardiac:  normal S1, S2; RRR; no murmur  Lungs:  clear to auscultation bilaterally, no wheezing, rhonchi or rales  Abd: soft, nontender, no hepatomegaly  Ext: no edema  Musculoskeletal:  No deformities, BUE and BLE strength normal and equal Skin: warm and dry  Neuro:  CNs 2-12 intact, no focal abnormalities noted Psych:  Normal affect    EKG:  The ECG that was done 07/20/22 was personally reviewed and demonstrates normal sinus rhythm without ST segment changes or TWI  Relevant CV Studies: coronary calcium score in 12/2021 with a score of 2.75 with calcifications only noted in the LAD (77th percentile).  Laboratory Data:  High Sensitivity Troponin:   Recent Labs  Lab 07/20/22 1255 07/20/22 1353  TROPONINIHS 2 2      Chemistry Recent Labs  Lab 07/20/22 1255  NA 139  K 3.8  CL 101  CO2 28  GLUCOSE 99  BUN 16  CREATININE 0.96  CALCIUM 9.8  GFRNONAA >60  ANIONGAP 10    No results for input(s): "PROT", "ALBUMIN", "AST", "ALT", "ALKPHOS", "BILITOT" in the last 168 hours. Lipids No results for input(s): "CHOL", "TRIG", "HDL", "LABVLDL", "LDLCALC", "CHOLHDL" in the last 168 hours. Hematology Recent Labs  Lab 07/20/22 1255  WBC 5.5  RBC 4.46  HGB 13.6  HCT 39.3  MCV 88.1  MCH 30.5  MCHC 34.6  RDW 12.2  PLT 227   Thyroid No results for input(s): "TSH", "FREET4" in the last 168 hours. BNPNo results for input(s): "BNP", "PROBNP" in the last 168 hours.  DDimer No results for input(s): "DDIMER" in the last 168 hours.   Radiology/Studies:  DG Chest Port 1 View  Result Date: 07/20/2022 CLINICAL DATA:  Chest pain EXAM: PORTABLE  CHEST 1 VIEW COMPARISON:  04/08/2015 FINDINGS: Cardiac and mediastinal contours are within normal limits given AP technique. No focal pulmonary opacity. No pleural effusion or pneumothorax. No acute osseous abnormality. IMPRESSION: No acute cardiopulmonary process. Electronically Signed   By: Merilyn Baba M.D.   On: 07/20/2022 12:30     Assessment and Plan:   1.  Acute chest pain, possible cardiac Presenting with likely noncardiac symptoms -> substernal squeezing/pressure but not worse with exertion and does not improve with rest.  Does have CV risk factors including HTN, dyslipidemia, coronary Ca score in the 77th percentile, and positive family history of ischemic heart disease.  EKG reassuring and normal hsTNT thus far.  No active CP. HEART score 4 (Risk of MACE of 12-16.6%).  Calculated CAD consortium using clinical + CCS model (The Clinical + CCS Model estimates the probability based on age, sex, symptoms, cardiovascular risk factors, and the coronary calcium score (CCS)) = 2-3% (depending on if characterize current CP as "atypical" or "nonspecific") -> pretest probability of CAD in those with chest pain.  Given absence of known significant coronary calcifications, will obtain CCTA in AM with possible FFR analysis if needed.  No concern for problems with SLNTG or beta blockade administration for the test. - CCTA - hold on heparin for ACS tx - obtain A1c - Has had recent lipid panel and TSH - Continue ASA and statin - HTN treatment as below  2.  Chronic Hypertension - Continue home medications including hydrochlorothiazide, beta-blocker and amlodipine  Risk Assessment/Risk Scores:           Severity of Illness: The appropriate patient status for this patient is INPATIENT. Inpatient status is judged to be reasonable and necessary in order to provide the required intensity of service to ensure the patient's safety. The patient's presenting symptoms, physical exam findings, and initial  radiographic and laboratory data in the context of their chronic comorbidities is felt to place them  at high risk for further clinical deterioration. Furthermore, it is not anticipated that the patient will be medically stable for discharge from the hospital within 2 midnights of admission.   * I certify that at the point of admission it is my clinical judgment that the patient will require inpatient hospital care spanning beyond 2 midnights from the point of admission due to high intensity of service, high risk for further deterioration and high frequency of surveillance required.*   For questions or updates, please contact Anon [facility] Please consult [email or web address] for contact info under     Signed, Jeralene Huff, MD  07/20/2022 8:12 PM

## 2022-07-20 NOTE — Telephone Encounter (Signed)
[name] [phone]  [name] just called to say she is having feeling of tightness in her chest this morning along with a squeezing feeling around her sternum. She also had this feeling on Monday along with some arm numbness but it went away.  She would like to come in as soon as possible, she is scared. She said you found her moms heart attach years ago.

## 2022-07-20 NOTE — ED Provider Notes (Signed)
[facility] Provider Note   CSN: [id number] Arrival date & time: 07/20/22  1136     History  Chief Complaint  Patient presents with   Chest Pain    [name] is a 57 y.o. female with PMH HTN and migraine headaches presents to ED c/o chest pain. Pt reports she has had 3 episodes of chest pain over the last 3 days.  She states that 3 days ago she woke up and had chest tightness around her sternum that lasted about 1 hour before resolving.  She had another episode of this last night that lasted about 2 hours and another episode that started this morning and has persisted.  She contacted her PCP for advising who recommended that she come to the ED for further evaluation.  Patient denies associated nausea, vomiting, lightheadedness, dizziness, shortness of breath, cough, congestion, diaphoresis, syncope, leg pain or swelling, abdominal pain, or other acute symptoms today.  She does note that her mom had an MI in her early 60s and has required multiple cardiac stent placements.  Patient does not have any personal cardiac history.  She has been taking aspirin at home for her pain with no relief.      Home Medications Prior to Admission medications   Medication Sig Start Date End Date Taking? Authorizing Provider  acetaminophen (TYLENOL) 500 MG tablet Take 1,000 mg by mouth every 6 (six) hours as needed for moderate pain.   Yes [provider]  amLODipine (NORVASC) 5 MG tablet TAKE 1 TABLET(5 MG) BY MOUTH EVERY MORNING 03/06/22  Yes Baxley, Cresenciano Lick, MD  aspirin 81 MG tablet Take 81 mg by mouth as needed.    Yes [provider]  b complex vitamins capsule Take 1 capsule by mouth as needed.   Yes [provider]  cholecalciferol (VITAMIN D) 1000 units tablet Take 1,000 Units by mouth daily.   Yes [provider]  hydrochlorothiazide (HYDRODIURIL) 25 MG tablet TAKE 1 TABLET(25 MG) BY MOUTH DAILY 03/06/22  Yes  Baxley, Cresenciano Lick, MD  ibuprofen (ADVIL,MOTRIN) 200 MG tablet Take 200 mg by mouth every 6 (six) hours as needed for moderate pain.    Yes [provider]  lansoprazole (PREVACID) 30 MG capsule Take 30 mg by mouth 2 (two) times daily before a meal.   Yes [provider]  loratadine (CLARITIN) 10 MG tablet Take 10 mg by mouth daily.   Yes [provider]  metoprolol tartrate (LOPRESSOR) 25 MG tablet Take 1 tablet (25 mg total) by mouth daily. 03/06/22  Yes Baxley, Cresenciano Lick, MD  naproxen sodium (ANAPROX) 220 MG tablet Take 440 mg by mouth 2 (two) times daily as needed (pain).   Yes [provider]  Probiotic Product (PROBIOTIC-10 PO) Take by mouth.   Yes [provider]  rosuvastatin (CRESTOR) 5 MG tablet TAKE 1 TABLET(5 MG) BY MOUTH DAILY 03/09/22  Yes Baxley, Cresenciano Lick, MD  Vitamin C-Vitamin D-Zinc (D3/VITAMIN C/ZINC PO) Take by mouth daily.   Yes [provider]  triamcinolone cream (KENALOG) 0.1 % Apply 1 application. topically 2 (two) times daily. Patient not taking: Reported on 07/20/2022 09/01/21   Elby Showers, MD      Allergies    Cephalexin and Pantoprazole    Review of Systems   Review of Systems  All other systems reviewed and are negative.   Physical Exam Updated Vital Signs BP 118/63   Pulse (!) 53   Temp 98.2  F (36.8 C) (Oral)   Resp 16   Ht 5\' 3"  (1.6 m)   Wt 86.2 kg   SpO2 96%   BMI 33.66 kg/m  Physical Exam Vitals and nursing note reviewed.  Constitutional:      General: She is not in acute distress.    Appearance: Normal appearance. She is not ill-appearing, toxic-appearing or diaphoretic.  HENT:     Head: Normocephalic and atraumatic.     Mouth/Throat:     Mouth: Mucous membranes are moist.  Eyes:     Conjunctiva/sclera: Conjunctivae normal.  Neck:     Vascular: No JVD.  Cardiovascular:     Rate and Rhythm: Normal rate and regular rhythm.     Heart sounds: Normal heart sounds. No murmur heard.    No S3  or S4 sounds.  Pulmonary:     Effort: Pulmonary effort is normal. No tachypnea or respiratory distress.     Breath sounds: Normal breath sounds. No stridor. No decreased breath sounds, wheezing, rhonchi or rales.  Abdominal:     General: Abdomen is flat.     Palpations: Abdomen is soft.     Tenderness: There is no abdominal tenderness.  Musculoskeletal:        General: Normal range of motion.     Cervical back: Normal range of motion and neck supple.     Right lower leg: No tenderness. No edema.     Left lower leg: No tenderness. No edema.  Skin:    General: Skin is warm and dry.     Capillary Refill: Capillary refill takes less than 2 seconds.  Neurological:     Mental Status: She is alert. Mental status is at baseline.  Psychiatric:        Mood and Affect: Mood is anxious.        Behavior: Behavior is cooperative.     ED Results / Procedures / Treatments   Labs (all labs ordered are listed, but only abnormal results are displayed) Labs Reviewed  BASIC METABOLIC PANEL  CBC  PREGNANCY, URINE  TROPONIN I (HIGH SENSITIVITY)  TROPONIN I (HIGH SENSITIVITY)    EKG NSR at 63 bpm, normal intervals, nonspecific T wave changes, no STEMI  Radiology DG Chest Port 1 View  Result Date: 07/20/2022 CLINICAL DATA:  Chest pain EXAM: PORTABLE CHEST 1 VIEW COMPARISON:  04/08/2015 FINDINGS: Cardiac and mediastinal contours are within normal limits given AP technique. No focal pulmonary opacity. No pleural effusion or pneumothorax. No acute osseous abnormality. IMPRESSION: No acute cardiopulmonary process. Electronically Signed   By: Merilyn Baba M.D.   On: 07/20/2022 12:30    Procedures Procedures    Medications Ordered in ED Medications  nitroGLYCERIN (NITROSTAT) SL tablet 0.4 mg (has no administration in time range)    ED Course/ Medical Decision Making/ A&P                             Medical Decision Making Amount and/or Complexity of Data Reviewed Labs: ordered.  Decision-making details documented in ED Course. Radiology: ordered. Decision-making details documented in ED Course. ECG/medicine tests: ordered. Decision-making details documented in ED Course.  Risk Prescription drug management. Decision regarding hospitalization.   Medical Decision Making:   MIRSA FATE is a 56 y.o. female who presented to the ED today with chest pain detailed above.    Patient's presentation is complicated by their history of hypertension, hyperlipidemia, family history CAD.  Patient placed on  continuous vitals and telemetry monitoring while in ED which was reviewed periodically.  Complete initial physical exam performed, notably the patient  was in no acute distress.  Regular rate and rhythm.  Lungs clear to auscultation.  No chest wall tenderness.  Abdomen soft, nontender, and without rebound, guarding, pulsatile mass, or peritoneal signs.  No significant lower extremity tenderness or edema.  No JVD. Reviewed and confirmed nursing documentation for past medical history, family history, social history.    Initial Assessment:   With the patient's presentation of chest pain, differential diagnosis includes but is not limited to acute coronary syndrome, pericarditis, aortic dissection, pulmonary embolism, tension pneumothorax, esophageal rupture, angina, aortic stenosis, cardiomyopathy, myocarditis, mitral valve prolapse, pulmonary hypertension, hypertrophic obstructive cardiomyopathy (HOCM), aortic insufficiency, right ventricular hypertrophy, pneumonia, pleuritis, bronchitis, pneumothorax, tumor, gastroesophageal reflux disease (GERD), esophageal spasm, Mallory-Weiss syndrome, peptic ulcer disease, biliary disease, pancreatitis, functional gastrointestinal pain, cervical or thoracic disk disease or arthritis, shoulder arthritis, costochondritis, subacromial bursitis, anxiety or panic attack, herpes zoster, breast disorders, chest wall tumors, thoracic outlet syndrome,  mediastinitis.   This is most consistent with an acute complicated illness  ADDENDUM REPORT: 01/03/2022 16:17   ADDENDUM: The following report is an over-read performed by radiologist Dr. Dahlia Bailiff of [facility], [place] on 01/03/2022. This over-read does not include interpretation of cardiac or coronary anatomy or pathology. The coronary calcium score interpretation by the cardiologist is attached.   COMPARISON:  Chest CT October 12, 2006.   FINDINGS: Vascular: No acute non-cardiac vascular finding.   Mediastinum/Nodes: Within the visualized portions of the thorax no pathologically enlarged mediastinal, or hilar lymph nodes, noting limited sensitivity for the detection of hilar adenopathy on this noncontrast study. Distal esophagus is grossly unremarkable.   Lungs/Pleura: Within the visualized portions of the thorax there are no suspicious appearing pulmonary nodules or masses, there is no acute consolidative airspace disease, no pleural effusions and no pneumothorax   Upper Abdomen: Visualized portions of the upper abdomen are unremarkable.   Musculoskeletal: No acute osseous abnormality.   IMPRESSION: No acute incidental noncardiac finding noted.   Aortic Atherosclerosis (ICD10-I70.0).     Electronically Signed   By: Dahlia Bailiff M.D.   On: 01/03/2022 16:17    Addended by Regan Lemming, MD on 01/03/2022  4:20 PM    Study Result  Narrative & Impression  CLINICAL DATA:  Cardiovascular Disease Risk stratification   EXAM: Coronary Calcium Score   TECHNIQUE: A gated, non-contrast computed tomography scan of the heart was performed using 45mm slice thickness. Axial images were analyzed on a dedicated workstation. Calcium scoring of the coronary arteries was performed using the Agatston method.   FINDINGS: Coronary Calcium Score:   Left main: 0   Left anterior descending artery: 2.75   Left circumflex artery: 0   Right coronary artery: 0    Total: 2.75   Percentile: 77   Pericardium: Normal.   Ascending Aorta: Normal caliber; mild aortic atherosclerosis.   Non-cardiac: See separate report from [facility].   IMPRESSION: Coronary calcium score of 2.75. This was 28 percentile for age-, race-, and sex-matched controls.   Mild aortic atherosclerosis.   RECOMMENDATIONS: Coronary artery calcium (CAC) score is a strong predictor of incident coronary heart disease (CHD) and provides predictive information beyond traditional risk factors. CAC scoring is reasonable to use in the decision to withhold, postpone, or initiate statin therapy in intermediate-risk or selected borderline-risk asymptomatic adults (age 37-75 years and LDL-C >=70 to <190 mg/dL) who do not have  diabetes or established atherosclerotic cardiovascular disease (ASCVD).* In intermediate-risk (10-year ASCVD risk >=7.5% to <20%) adults or selected borderline-risk (10-year ASCVD risk >=5% to <7.5%) adults in whom a CAC score is measured for the purpose of making a treatment decision the following recommendations have been made:   If CAC=0, it is reasonable to withhold statin therapy and reassess in 5 to 10 years, as long as higher risk conditions are absent (diabetes mellitus, family history of premature CHD in first degree relatives (males <55 years; females <65 years), cigarette smoking, or LDL >=190 mg/dL).   If CAC is 1 to 99, it is reasonable to initiate statin therapy for patients >=56 years of age.   If CAC is >=100 or >=75th percentile, it is reasonable to initiate statin therapy at any age.   Cardiology referral should be considered for patients with CAC scores >=400 or >=75th percentile.   *2018 AHA/ACC/AACVPR/AAPA/ABC/ACPM/ADA/AGS/APhA/ASPC/NLA/PCNA Guideline on the Management of Blood Cholesterol: A Report of the American College of Cardiology/American Heart Association Task Force on Clinical Practice Guidelines. J Am Coll  Cardiol. 2019;73(24):3168-3209.   Kirk Ruths, MD   Electronically Signed: By: Kirk Ruths M.D. On: 01/03/2022 14:32     Initial Plan:  Screening labs including CBC and Metabolic panel to evaluate for infectious or metabolic etiology of disease.  Urinalysis with reflex culture ordered to evaluate for UTI or relevant urologic/nephrologic pathology.  CXR to evaluate for structural/infectious intrathoracic pathology.  EKG to evaluate for cardiac pathology Objective evaluation as reviewed   Initial Study Results:   Laboratory  All laboratory results reviewed without evidence of clinically relevant pathology.    EKG EKG was reviewed independently. ST segments without concerns for elevations.   EKG: normal sinus rhythm, nonspecific ST and T waves changes.   Radiology:  All images reviewed independently. Agree with radiology report at this time.   DG Chest Port 1 View  Result Date: 07/20/2022 CLINICAL DATA:  Chest pain EXAM: PORTABLE CHEST 1 VIEW COMPARISON:  04/08/2015 FINDINGS: Cardiac and mediastinal contours are within normal limits given AP technique. No focal pulmonary opacity. No pleural effusion or pneumothorax. No acute osseous abnormality. IMPRESSION: No acute cardiopulmonary process. Electronically Signed   By: Merilyn Baba M.D.   On: 07/20/2022 12:30      Consults: Case discussed with cardiology by attending physician who cosigned this note and he will be admitting patient.   Final Assessment and Plan:   This is a 57 year old female with a past medical history of hypertension, hyperlipidemia who presents to the ED with episodes of chest pain over the last 3 days.  Patient describes pain as a tightness to the bottom of her sternum with an episode on Monday which resolved spontaneously but then recurred last night and then again this morning and has persisted.  She does have a family history of MI in her mother in her early 39s.  EKG with nonspecific ST-T changes.   Patient has not had relief of pain with using aspirin at home.  Vital signs are stable here.  Workup obtained as above for further evaluation.  In discussion with attending physician who cosigned this note, patient does have story concerning for ACS and with multiple risk factors would recommend admission to the hospital for unstable angina.  Patient given nitroglycerin in ED for treatment of her pain.  Currently in no acute distress.  Workup essentially otherwise unremarkable with 2 negative cardiac enzymes, normal chest x-ray.  Patient did have recent calcium study that placed her  in the 77th percentile with notable calcification to LAD. With risk factors, cardiology consulted and will admit patient for further cardiac evaluation and workup. Pt agreeable with this plan. Stable at time of disposition.    Clinical Impression:  1. Unstable angina [facility])      Admit           Final Clinical Impression(s) / ED Diagnoses Final diagnoses:  Unstable angina [facility])    Rx / DC Orders ED Discharge Orders     None         Suzzette Righter, PA-C 07/20/22 1730    Pattricia Boss, MD 07/21/22 1645

## 2022-07-21 ENCOUNTER — Inpatient Hospital Stay (HOSPITAL_COMMUNITY): Payer: No Typology Code available for payment source

## 2022-07-21 DIAGNOSIS — R079 Chest pain, unspecified: Secondary | ICD-10-CM

## 2022-07-21 DIAGNOSIS — R072 Precordial pain: Secondary | ICD-10-CM

## 2022-07-21 LAB — HEPATIC FUNCTION PANEL
ALT: 30 U/L (ref 0–44)
AST: 26 U/L (ref 15–41)
Albumin: 3.8 g/dL (ref 3.5–5.0)
Alkaline Phosphatase: 65 U/L (ref 38–126)
Bilirubin, Direct: 0.1 mg/dL (ref 0.0–0.2)
Indirect Bilirubin: 0.5 mg/dL (ref 0.3–0.9)
Total Bilirubin: 0.6 mg/dL (ref 0.3–1.2)
Total Protein: 6.2 g/dL — ABNORMAL LOW (ref 6.5–8.1)

## 2022-07-21 LAB — CBC
HCT: 39.4 % (ref 36.0–46.0)
Hemoglobin: 13.3 g/dL (ref 12.0–15.0)
MCH: 30.1 pg (ref 26.0–34.0)
MCHC: 33.8 g/dL (ref 30.0–36.0)
MCV: 89.1 fL (ref 80.0–100.0)
Platelets: 222 10*3/uL (ref 150–400)
RBC: 4.42 MIL/uL (ref 3.87–5.11)
RDW: 12.2 % (ref 11.5–15.5)
WBC: 5.9 10*3/uL (ref 4.0–10.5)
nRBC: 0 % (ref 0.0–0.2)

## 2022-07-21 LAB — BASIC METABOLIC PANEL
Anion gap: 11 (ref 5–15)
BUN: 18 mg/dL (ref 6–20)
CO2: 25 mmol/L (ref 22–32)
Calcium: 9.1 mg/dL (ref 8.9–10.3)
Chloride: 102 mmol/L (ref 98–111)
Creatinine, Ser: 0.99 mg/dL (ref 0.44–1.00)
GFR, Estimated: >60 mL/min (ref >60)
Glucose, Bld: 115 mg/dL — ABNORMAL HIGH (ref 70–99)
Potassium: 3.5 mmol/L (ref 3.5–5.1)
Sodium: 138 mmol/L (ref 135–145)

## 2022-07-21 LAB — HIV ANTIBODY (ROUTINE TESTING W REFLEX): HIV Screen 4th Generation wRfx: NONREACTIVE

## 2022-07-21 LAB — HEMOGLOBIN A1C
Hgb A1c MFr Bld: 5.9 % — ABNORMAL HIGH (ref 4.8–5.6)
Mean Plasma Glucose: 123 mg/dL

## 2022-07-21 MED ORDER — NITROGLYCERIN 0.4 MG SL SUBL
SUBLINGUAL_TABLET | SUBLINGUAL | Status: AC
  Filled 2022-07-21: qty 2

## 2022-07-21 MED ORDER — ROSUVASTATIN CALCIUM 10 MG PO TABS: 10.0000 mg | ORAL_TABLET | Freq: Every day | ORAL | 1 refills | Status: DC

## 2022-07-21 MED ORDER — IOHEXOL 350 MG/ML SOLN
100.0000 mL | Freq: Once | INTRAVENOUS | Status: AC | PRN
  Administered 2022-07-21: 100 mL via INTRAVENOUS

## 2022-07-21 NOTE — Discharge Summary (Signed)
Discharge Summary    Patient ID: [name] MRN: [id number]; DOB: 12-18-1965  Admit date: 07/20/2022 Discharge date: 07/21/2022  PCP:  Elby Showers, MD   [place] Providers Cardiologist:  None        Discharge Diagnoses    Principal Problem:   Acute chest pain Active Problems:   Unstable angina [facility])    Diagnostic Studies/Procedures    07/21/22 CT CORONARY MORPH W/CTA COR W/SCORE W/CA W/CM   Coronary Arteries:  Normal coronary origin.  Right dominance.   RCA is a large dominant artery that gives rise to PDA and PLA. There is no plaque.   Left main is a large artery that gives rise to LAD and LCX arteries. Calcified plaque in left main causes 0-24% stenosis   LAD is a large vessel that has no plaque.  There is no plaque   LCX is a non-dominant artery that gives rise to one large OM1 branch. There is no plaque.   Other findings:   Left Ventricle: Normal size   Left Atrium: Normal size   Pulmonary Veins: Normal configuration   Right Ventricle: Normal size   Right Atrium: Normal size   Cardiac valves: No calcifications   Thoracic aorta: Normal size   Pulmonary Arteries: Normal size   Systemic Veins: Normal drainage   Pericardium: Normal thickness   IMPRESSION: 1. Coronary calcium score of 5. This was 77th percentile for age and sex matched control.   2. Normal coronary origin with right dominance.   3. 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 Minimal non-obstructive CAD (0-24%). Consider non-atherosclerotic causes of chest pain. Consider preventive therapy and risk factor modification. _____________   History of Present Illness     [name] is a 57 y.o. female with hx hypertension, mixed hyperlipidemia, and migraine headaches. Admitted with symptoms of chest pain described as a tightness. Pain was not associated with exertion or other provocation. This  discomfort occurred over multiple episodes in the last few days. No dyspnea, changes to exertional tolerance, palpitations, dizziness/lightheadedness. Family cardiac hx notable. Patient's mother had MI in early 77s.   Hospital Course     Consultants: n/a   Atypical chest pain Mixed hyperlipidemia  As above, patient admitted with chest pain, squeezing/tightness in nature. Her BMP and CBC were completely unremarkable. High-sensitivity troponin 2->2. Her EKG is without ischemic EKG changes including T wave inversions or ST segment changes. Chest x-ray is unremarkable.  Given lack of known CAD, cardiac CTA completed. Coronary calcium score of 5 with nonobstructive CAD, calcified plaque in left main causing minimal (0-24%) stenosis.  Given no obstructive disease, low suspicion for cardiac etiology. Will increase Crestor to 10mg  (goal LDL <70) and schedule outpatient follow up.   Hypertension  BP at goal. Continue Amlodipine 5mg , HCTZ 25mg , Metoprolol Tartrate 25mg  daily.  After discussing further with patient today, suspect a degree of undiagnosed OSA as patient reports being advised of nocturnal apneic episodes. Would consider outpatient sleep study for further evaluation.      Did the patient have an acute coronary syndrome (MI, NSTEMI, STEMI, etc) this admission?:  No                               Did the patient have a percutaneous coronary intervention (stent / angioplasty)?:  No.          _____________  Discharge Vitals Blood pressure 123/73, pulse 68, temperature 98.4 F (36.9 C), temperature source Oral, resp. rate 19, height 5\' 3"  (1.6 m), weight 89.2 kg, SpO2 96 %.  Filed Weights   07/20/22 1149 07/20/22 2129  Weight: 86.2 kg 89.2 kg    Labs & Radiologic Studies    CBC Recent Labs    07/20/22 1255 07/21/22 0408  WBC 5.5 5.9  HGB 13.6 13.3  HCT 39.3 39.4  MCV 88.1 89.1  PLT 227 [id number]   Basic Metabolic Panel Recent Labs    07/20/22 1255 07/21/22 0408  NA 139  138  K 3.8 3.5  CL 101 102  CO2 28 25  GLUCOSE 99 115*  BUN 16 18  CREATININE 0.96 0.99  CALCIUM 9.8 9.1   Liver Function Tests Recent Labs    07/21/22 0408  AST 26  ALT 30  ALKPHOS 65  BILITOT 0.6  PROT 6.2*  ALBUMIN 3.8   No results for input(s): "LIPASE", "AMYLASE" in the last 72 hours. High Sensitivity Troponin:   Recent Labs  Lab 07/20/22 1255 07/20/22 1353  TROPONINIHS 2 2    BNP Invalid input(s): "POCBNP" D-Dimer No results for input(s): "DDIMER" in the last 72 hours. Hemoglobin A1C No results for input(s): "HGBA1C" in the last 72 hours. Fasting Lipid Panel No results for input(s): "CHOL", "HDL", "LDLCALC", "TRIG", "CHOLHDL", "LDLDIRECT" in the last 72 hours. Thyroid Function Tests No results for input(s): "TSH", "T4TOTAL", "T3FREE", "THYROIDAB" in the last 72 hours.  Invalid input(s): "FREET3" _____________  CT CORONARY MORPH W/CTA COR W/SCORE W/CA W/CM &/OR WO/CM  Addendum Date: 07/21/2022   ADDENDUM REPORT: 07/21/2022 15:00 EXAM: OVER-READ INTERPRETATION  CT CHEST The following report is an over-read performed by radiologist Dr. Katheren Puller [facility] on 07/21/2022. This over-read does not include interpretation of cardiac or coronary anatomy or pathology. The coronary CTA interpretation by the cardiologist is attached. COMPARISON:  01/03/2022 FINDINGS: Vascular: No acute noncardiac vascular finding. Mediastinum/nodes: Visualized portions of the bronchial tree and esophagus are unremarkable. No pathologic adenopathy. Lungs: No acute pleural or parenchymal lung disease. Central airways are patent. Upper abdomen: No acute upper abdominal finding. Musculoskeletal: No acute bony abnormalities. IMPRESSION: 1. No acute noncardiac abnormality identified. Electronically Signed   By: Randa Ngo M.D.   On: 07/21/2022 15:00   Result Date: 07/21/2022 CLINICAL DATA:  44F presents with chest pain EXAM: Cardiac/Coronary CTA TECHNIQUE: The patient was  scanned on a [employer]. FINDINGS: A 100 kV prospective scan was triggered in the descending thoracic aorta at 111 HU's. Axial non-contrast 3 mm slices were carried out through the heart. The data set was analyzed on a dedicated work station and scored using the [employer]. Gantry rotation speed was 250 msecs and collimation was .6 mm. No beta blockade and 0.8 mg of sl NTG was given. The 3D data set was reconstructed in 5% intervals of the 35-75% of the R-R cycle. Phases were analyzed on a dedicated work station using MPR, MIP and VRT modes. The patient received 100 cc of contrast. Coronary Arteries:  Normal coronary origin.  Right dominance. RCA is a large dominant artery that gives rise to PDA and PLA. There is no plaque. Left main is a large artery that gives rise to LAD and LCX arteries. Calcified plaque in left main causes 0-24% stenosis LAD is a large vessel that has no plaque.  There is no plaque LCX is a non-dominant artery that gives rise to one large OM1 branch. There  is no plaque. Other findings: Left Ventricle: Normal size Left Atrium: Normal size Pulmonary Veins: Normal configuration Right Ventricle: Normal size Right Atrium: Normal size Cardiac valves: No calcifications Thoracic aorta: Normal size Pulmonary Arteries: Normal size Systemic Veins: Normal drainage Pericardium: Normal thickness IMPRESSION: 1. Coronary calcium score of 5. This was 77th percentile for age and sex matched control. 2. Normal coronary origin with right dominance. 3. 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 Minimal non-obstructive CAD (0-24%). Consider non-atherosclerotic causes of chest pain. Consider preventive therapy and risk factor modification. Electronically Signed: By: Oswaldo Milian M.D. On: 07/21/2022 14:40   DG Chest Port 1 View  Result Date: 07/20/2022 CLINICAL DATA:  Chest pain EXAM: PORTABLE CHEST 1 VIEW COMPARISON:  04/08/2015  FINDINGS: Cardiac and mediastinal contours are within normal limits given AP technique. No focal pulmonary opacity. No pleural effusion or pneumothorax. No acute osseous abnormality. IMPRESSION: No acute cardiopulmonary process. Electronically Signed   By: Merilyn Baba M.D.   On: 07/20/2022 12:30   Disposition   Pt is being discharged home today in good condition.  Follow-up Plans & Appointments     Discharge Instructions     Diet - low sodium heart healthy   Complete by: As directed    Increase activity slowly   Complete by: As directed         Discharge Medications   Allergies as of 07/21/2022       Reactions   Cephalexin Nausea And Vomiting   Pantoprazole Other (See Comments)   Headache        Medication List     TAKE these medications    acetaminophen 500 MG tablet Commonly known as: TYLENOL Take 1,000 mg by mouth every 6 (six) hours as needed for moderate pain.   amLODipine 5 MG tablet Commonly known as: NORVASC TAKE 1 TABLET(5 MG) BY MOUTH EVERY MORNING   aspirin 81 MG tablet Take 81 mg by mouth as needed.   b complex vitamins capsule Take 1 capsule by mouth as needed.   cholecalciferol 1000 units tablet Commonly known as: VITAMIN D Take 1,000 Units by mouth daily.   D3/VITAMIN C/ZINC PO Take by mouth daily.   hydrochlorothiazide 25 MG tablet Commonly known as: HYDRODIURIL TAKE 1 TABLET(25 MG) BY MOUTH DAILY   ibuprofen 200 MG tablet Commonly known as: ADVIL Take 200 mg by mouth every 6 (six) hours as needed for moderate pain.   lansoprazole 30 MG capsule Commonly known as: PREVACID Take 30 mg by mouth 2 (two) times daily before a meal.   loratadine 10 MG tablet Commonly known as: CLARITIN Take 10 mg by mouth daily.   metoprolol tartrate 25 MG tablet Commonly known as: LOPRESSOR Take 1 tablet (25 mg total) by mouth daily.   naproxen sodium 220 MG tablet Commonly known as: ALEVE Take 440 mg by mouth 2 (two) times daily as needed  (pain).   PROBIOTIC-10 PO Take by mouth.   rosuvastatin 10 MG tablet Commonly known as: Crestor Take 1 tablet (10 mg total) by mouth daily. What changed:  medication strength how much to take how to take this when to take this additional instructions   triamcinolone cream 0.1 % Commonly known as: KENALOG Apply 1 application. topically 2 (two) times daily.           Outstanding Labs/Studies     Duration of Discharge Encounter   Greater than 30 minutes including physician time.  Signed, Lily Kocher,  PA-C 07/21/2022, 3:57 PM

## 2022-07-21 NOTE — Progress Notes (Addendum)
Rounding Note    Patient Name: [name] Date of Encounter: 07/21/2022  [place] Cardiologist: None   Subjective   Patient is feeling well this morning. Denies recurrence of chest tightness since moving to [facility] from [place]. No palpitations or dyspnea.  Inpatient Medications    Scheduled Meds:  amLODipine  5 mg Oral Daily   aspirin EC  81 mg Oral Daily   enoxaparin (LOVENOX) injection  40 mg Subcutaneous Q24H   hydrochlorothiazide  25 mg Oral Daily   metoprolol tartrate  25 mg Oral Daily   pantoprazole  40 mg Oral Daily   rosuvastatin  5 mg Oral QHS   Continuous Infusions:  PRN Meds: acetaminophen, nitroGLYCERIN, ondansetron (ZOFRAN) IV   Vital Signs    Vitals:   07/20/22 1900 07/20/22 1945 07/20/22 2129 07/20/22 2333  BP: 120/75  109/62 114/65  Pulse:  62 (!) 55 70  Resp: 14 10 18    Temp:   97.9 F (36.6 C) 97.6 F (36.4 C)  TempSrc:   Oral Oral  SpO2:  98% 93% 96%  Weight:   89.2 kg   Height:   5\' 3"  (1.6 m)    No intake or output data in the 24 hours ending 07/21/22 0726    07/20/2022    9:29 PM 07/20/2022   11:49 AM 02/22/2022   11:12 AM  Last 3 Weights  Weight (lbs) 196 lb 11.2 oz 190 lb 200 lb 12.8 oz  Weight (kg) 89.223 kg 86.183 kg 91.082 kg      Telemetry    Sinus rhythm - Personally Reviewed  ECG    No new tracing - Personally Reviewed  Physical Exam   GEN: No acute distress.   Neck: No JVD Cardiac: RRR, no murmurs, rubs, or gallops.  Respiratory: Clear to auscultation bilaterally. GI: Soft, nontender, non-distended  MS: No edema; No deformity. Neuro:  Nonfocal  Psych: Normal affect   Labs    High Sensitivity Troponin:   Recent Labs  Lab 07/20/22 1255 07/20/22 1353  TROPONINIHS 2 2     Chemistry Recent Labs  Lab 07/20/22 1255 07/21/22 0408  NA 139 138  K 3.8 3.5  CL 101 102  CO2 28 25  GLUCOSE 99 115*  BUN 16 18  CREATININE 0.96 0.99  CALCIUM 9.8 9.1  PROT  --  6.2*  ALBUMIN  --   3.8  AST  --  26  ALT  --  30  ALKPHOS  --  65  BILITOT  --  0.6  GFRNONAA >60 >60  ANIONGAP 10 11    Lipids No results for input(s): "CHOL", "TRIG", "HDL", "LABVLDL", "LDLCALC", "CHOLHDL" in the last 168 hours.  Hematology Recent Labs  Lab 07/20/22 1255 07/21/22 0408  WBC 5.5 5.9  RBC 4.46 4.42  HGB 13.6 13.3  HCT 39.3 39.4  MCV 88.1 89.1  MCH 30.5 30.1  MCHC 34.6 33.8  RDW 12.2 12.2  PLT 227 222   Thyroid No results for input(s): "TSH", "FREET4" in the last 168 hours.  BNPNo results for input(s): "BNP", "PROBNP" in the last 168 hours.  DDimer No results for input(s): "DDIMER" in the last 168 hours.   Radiology    DG Chest Port 1 View  Result Date: 07/20/2022 CLINICAL DATA:  Chest pain EXAM: PORTABLE CHEST 1 VIEW COMPARISON:  04/08/2015 FINDINGS: Cardiac and mediastinal contours are within normal limits given AP technique. No focal pulmonary opacity. No pleural effusion or pneumothorax. No acute osseous  abnormality. IMPRESSION: No acute cardiopulmonary process. Electronically Signed   By: Merilyn Baba M.D.   On: 07/20/2022 12:30    Cardiac Studies   01/03/22 CT Cardiac Score  FINDINGS: Coronary Calcium Score:   Left main: 0   Left anterior descending artery: 2.75   Left circumflex artery: 0   Right coronary artery: 0   Total: 2.75   Percentile: 77   Pericardium: Normal.   Ascending Aorta: Normal caliber; mild aortic atherosclerosis.   Non-cardiac: See separate report from [facility].   IMPRESSION: Coronary calcium score of 2.75. This was 36 percentile for age-, race-, and sex-matched controls.   Mild aortic atherosclerosis.  Patient Profile     [name] is a 57 y.o. female with hypertension and migraine headaches who was first seen 07/20/2022 for the evaluation of chest pain.  Assessment & Plan    Chest pain  Patient admitted with symptoms of chest pain described as a tightness. This discomfort occurred over multiple episodes  in the last few days. No dyspnea, changes to exertional tolerance, palpitations, dizziness/lightheadedness. Family cardiac hx notable. Patient's mother had MI in early 71s. High-sensitivity troponin 2-2. Her EKG is without ischemic EKG changes including T wave inversions or ST segment changes.   Plan for cardiac CTA today given family CAD hx and 2023 CT cardiac calcium score 2.75 (77 percentile).  Hypertension  BP at goal. Continue Amlodipine 5mg , HCTZ 25mg , Metoprolol Tartrate 25mg  daily  Hx mixed hyperlipidemia  Continue Crestor 5mg   Lab Results  Component Value Date   CHOL 182 02/16/2022   HDL 67 02/16/2022   LDLCALC 92 02/16/2022   TRIG 134 02/16/2022   CHOLHDL 2.7 02/16/2022     For questions or updates, please contact [place] Please consult [email or web address] for contact info under        Signed, Lily Kocher, PA-C  07/21/2022, 7:26 AM    Patient seen and examined with Lily Kocher PA-C.  Agree as above, with the following exceptions and changes as noted below. Feels well with no recurrence of chest pain since admission. Gen: NAD, CV: RRR, no murmurs, Lungs: clear, Abd: soft, Extrem: Warm, well perfused, no edema, Neuro/Psych: alert and oriented x 3, normal mood and affect. All available labs, radiology testing, previous records reviewed. Will plan for coronary CTA today to exclude obstructive CAD. HR is permissible and IV is appropriate.   Elouise Munroe, MD 07/21/22 9:54 AM

## 2022-07-22 LAB — LIPOPROTEIN A (LPA): Lipoprotein (a): 44.5 nmol/L — ABNORMAL HIGH (ref <75.0)

## 2022-07-25 ENCOUNTER — Telehealth: Payer: Self-pay

## 2022-07-25 NOTE — Telephone Encounter (Signed)
[name] was D/C from hospital on 07/21/2022

## 2022-07-25 NOTE — Telephone Encounter (Signed)
1st attempt to call patient for TOC unsuccessful, left voicemail to call the office

## 2022-07-25 NOTE — Transitions of Care (Post Inpatient/ED Visit) (Signed)
   07/25/2022  Name: [name] MRN: [id number] DOB: September 30, 1965  Today's TOC FU Call Status: Today's TOC FU Call Status:: Unsuccessul Call (1st Attempt) Unsuccessful Call (1st Attempt) Date: 07/25/22  Attempted to reach the patient regarding the most recent Inpatient/ED visit.  Follow Up Plan: Additional outreach attempts will be made to reach the patient to complete the Transitions of Care (Post Inpatient/ED visit) call.   Signature Azul Coffie D, CMA

## 2022-07-26 ENCOUNTER — Telehealth: Payer: Self-pay

## 2022-07-26 NOTE — Transitions of Care (Post Inpatient/ED Visit) (Signed)
   07/26/2022  Name: [name] MRN: [id number] DOB: [birth year]  Today's TOC FU Call Status: Today's TOC FU Call Status:: Unsuccessful Call (2nd Attempt) Unsuccessful Call (2nd Attempt) Date: 07/26/22  Attempted to reach the patient regarding the most recent Inpatient/ED visit.  Follow Up Plan: Additional outreach attempts will be made to reach the patient to complete the Transitions of Care (Post Inpatient/ED visit) call.   Signature Blyss Lugar D, CMA

## 2022-07-27 ENCOUNTER — Telehealth: Payer: Self-pay

## 2022-07-27 NOTE — Transitions of Care (Post Inpatient/ED Visit) (Signed)
   07/27/2022  Name: [name] MRN: [id number] DOB: [birth year]  Today's TOC FU Call Status: Today's TOC FU Call Status:: Successful TOC FU Call Competed TOC FU Call Complete Date: 07/27/22  Transition Care Management Follow-up Telephone Call [facility]charge [facility]) Type of Discharge: Emergency Department Reason for ED Visit: Cardiac Conditions Cardiac Conditions Diagnosis: Chest Pain Persisting How have you been since you were released from the hospital?: Same Any questions or concerns?: No  Items Reviewed: Did you receive and understand the discharge instructions provided?: Yes Medications obtained and verified?: Yes (Medications Reviewed) Any new allergies since your discharge?: No Dietary orders reviewed?: No Do you have support at home?: Yes People in Home: spouse  Home Care and Equipment/Supplies: [place] Ordered?: NA Any new equipment or medical supplies ordered?: NA  Functional Questionnaire: Do you need assistance with bathing/showering or dressing?: No Do you need assistance with meal preparation?: No Do you need assistance with eating?: No Do you have difficulty maintaining continence: No Do you need assistance with getting out of bed/getting out of a chair/moving?: No Do you have difficulty managing or taking your medications?: No  Follow up appointments reviewed: PCP Follow-up appointment confirmed?: No Specialist Hospital Follow-up appointment confirmed?: Yes Date of Specialist follow-up appointment?: 08/21/22 Follow-Up Specialty Provider:: Dr. Margaretann Loveless Do you need transportation to your follow-up appointment?: No    SIGNATURE Mattelyn Imhoff D, CMA

## 2022-08-18 ENCOUNTER — Ambulatory Visit: Payer: No Typology Code available for payment source | Admitting: Internal Medicine

## 2022-08-21 ENCOUNTER — Encounter: Payer: Self-pay | Admitting: Internal Medicine

## 2022-08-21 ENCOUNTER — Ambulatory Visit: Payer: No Typology Code available for payment source | Attending: Internal Medicine | Admitting: Internal Medicine

## 2022-08-21 VITALS — BP 136/86 | HR 74 | Ht 63.0 in | Wt 200.2 lb

## 2022-08-21 DIAGNOSIS — E785 Hyperlipidemia, unspecified: Secondary | ICD-10-CM

## 2022-08-21 DIAGNOSIS — R079 Chest pain, unspecified: Secondary | ICD-10-CM

## 2022-08-21 DIAGNOSIS — R0683 Snoring: Secondary | ICD-10-CM

## 2022-08-21 DIAGNOSIS — I10 Essential (primary) hypertension: Secondary | ICD-10-CM | POA: Diagnosis not present

## 2022-08-21 NOTE — Patient Instructions (Signed)
Medication Instructions:  No Changes In Medications at this time.  *If you need a refill on your cardiac medications before your next appointment, please call your pharmacy*  Lab Work  Please return for FASTING Blood Work in [place] 2024. No appointment needed, lab here at the office is open Monday-Friday from 8AM to 4PM and closed daily for lunch from 12:45-1:45.   If you have labs (blood work) drawn today and your tests are completely normal, you will receive your results only by: MyChart Message (if you have MyChart) OR A paper copy in the mail If you have any lab test that is abnormal or we need to change your treatment, we will call you to review the results.  Testing/Procedures: REFERRAL TO PULMONOLOGY FOR SLEEP STUDY- SOMEONE WILL CALL YOU TO GET SCHEDULED  Follow-Up: At [facility], you and your health needs are our priority.  As part of our continuing mission to provide you with exceptional heart care, we have created designated Provider Care Teams.  These Care Teams include your primary Cardiologist (physician) and Advanced Practice Providers (APPs -  Physician Assistants and Nurse Practitioners) who all work together to provide you with the care you need, when you need it.  Your next appointment:   6 month(s)  Provider:   Dr. Jacques Navy

## 2022-08-21 NOTE — Progress Notes (Signed)
Cardiology Office Note:    Date:  08/21/2022   ID:  Mosetta Pigeon, DOB 06-18-1965, MRN [id number]  PCP:  Margaree Mackintosh, MD  Cardiologist:  Parke Poisson, MD  Electrophysiologist:  None   Referring MD: Margaree Mackintosh, MD   Chief Complaint/Reason for Referral: F/u chest pain  History of Present Illness:    Casey FRIESENHAHN is a 57 y.o. female with a history of HLD, HTN, chest pain who presents for follow up.  Met her in hospital with coronary CTA showing cal score 5 and minimal CAD. Recommended increase in crestor to 10 mg daily.  No more chest discomfort. No squeezing or pressure.   Tx for HTN is stable. May need evaluation for sleep apnea, this is not yet ordered. Episodes occurred when walking up. Snores with apneic episodes.  Patient is very interested in pursuing sleep study and feels this is largely the source of the symptoms.  The patient denies chest pain, chest pressure, dyspnea at rest or with exertion, palpitations, PND, orthopnea, or leg swelling. Denies cough, fever, chills. Denies nausea, vomiting. Denies syncope or presyncope. Denies dizziness or lightheadedness.   Past Medical History:  Diagnosis Date   Migraine     Past Surgical History:  Procedure Laterality Date   FOOT SURGERY Right 04/19/2021   LIPOMA EXCISION      Current Medications: Current Meds  Medication Sig   acetaminophen (TYLENOL) 500 MG tablet Take 1,000 mg by mouth every 6 (six) hours as needed for moderate pain.   amLODipine (NORVASC) 5 MG tablet TAKE 1 TABLET(5 MG) BY MOUTH EVERY MORNING   aspirin 81 MG tablet Take 81 mg by mouth as needed.    b complex vitamins capsule Take 1 capsule by mouth as needed.   cholecalciferol (VITAMIN D) 1000 units tablet Take 1,000 Units by mouth daily.   hydrochlorothiazide (HYDRODIURIL) 25 MG tablet TAKE 1 TABLET(25 MG) BY MOUTH DAILY   ibuprofen (ADVIL,MOTRIN) 200 MG tablet Take 200 mg by mouth every 6 (six) hours as needed for moderate pain.     lansoprazole (PREVACID) 30 MG capsule Take 30 mg by mouth 2 (two) times daily before a meal.   loratadine (CLARITIN) 10 MG tablet Take 10 mg by mouth daily.   metoprolol tartrate (LOPRESSOR) 25 MG tablet Take 1 tablet (25 mg total) by mouth daily.   naproxen sodium (ANAPROX) 220 MG tablet Take 440 mg by mouth 2 (two) times daily as needed (pain).   Probiotic Product (PROBIOTIC-10 PO) Take by mouth.   rosuvastatin (CRESTOR) 10 MG tablet Take 1 tablet (10 mg total) by mouth daily.   triamcinolone cream (KENALOG) 0.1 % Apply 1 application. topically 2 (two) times daily.   Vitamin C-Vitamin D-Zinc (D3/VITAMIN C/ZINC PO) Take by mouth daily.     Allergies:   Cephalexin and Pantoprazole   Social History   Tobacco Use   Smoking status: Never   Smokeless tobacco: Never  Substance Use Topics   Alcohol use: Yes    Comment: rarely   Drug use: No     Family History: The patient's family history includes Cancer in her father; Heart disease in her mother.  ROS:   Please see the history of present illness.    All other systems reviewed and are negative.  EKGs/Labs/Other Studies Reviewed:    The following studies were reviewed today:  EKG: Sinus rhythm, small Q waves in leads III and aVF which may represent inferior infarct pattern, seen subtly on prior EKGs.  Imaging studies that I have independently reviewed today: Coronary CTA 07/21/2022, minimal CAD.  Recent Labs: 02/16/2022: TSH 2.20 07/21/2022: ALT 30; BUN 18; Creatinine, Ser 0.99; Hemoglobin 13.3; Platelets 222; Potassium 3.5; Sodium 138  Recent Lipid Panel    Component Value Date/Time   CHOL 182 02/16/2022 1209   TRIG 134 02/16/2022 1209   HDL 67 02/16/2022 1209   CHOLHDL 2.7 02/16/2022 1209   VLDL 25 05/09/2012 1143   LDLCALC 92 02/16/2022 1209    Physical Exam:    VS:  BP 136/86   Pulse 74   Ht 5\' 3"  (1.6 m)   Wt 200 lb 3.2 oz (90.8 kg)   SpO2 94%   BMI 35.46 kg/m     Wt Readings from Last 5 Encounters:   08/21/22 200 lb 3.2 oz (90.8 kg)  07/20/22 196 lb 11.2 oz (89.2 kg)  02/22/22 200 lb 12.8 oz (91.1 kg)  09/01/21 189 lb 12 oz (86.1 kg)  02/17/21 193 lb (87.5 kg)    Constitutional: No acute distress Eyes: sclera non-icteric, normal conjunctiva and lids ENMT: normal dentition, moist mucous membranes Cardiovascular: regular rhythm, normal rate, no murmur. S1 and S2 normal. No jugular venous distention.  Respiratory: clear to auscultation bilaterally GI : normal bowel sounds, soft and nontender. No distention.   MSK: extremities warm, well perfused. No edema.  NEURO: grossly nonfocal exam, moves all extremities. PSYCH: alert and oriented x 3, normal mood and affect.   ASSESSMENT:    1. Chest pain, unspecified type   2. Hyperlipidemia, unspecified hyperlipidemia type   3. Primary hypertension   4. Snoring    PLAN:    Chest pain, unspecified type - Plan: EKG 12-Lead  Hyperlipidemia, unspecified hyperlipidemia type - Plan: EKG 12-Lead, Lipid panel, Hepatic function panel  Primary hypertension - Plan: EKG 12-Lead  Snoring - Plan: EKG 12-Lead, Ambulatory referral to Pulmonology  -No recurrence of chest pain. -Patient does snore and would like an evaluation for sleep apnea, I agree.  We talked about the mechanism of sleep apnea and how it contributes to pulmonary hypertension.  Referral to pulmonary medicine. -No changes required to therapy for hypertension, continue amlodipine, hydrochlorothiazide, metoprolol.  Did discuss with patient that metoprolol is technically to be dosed twice daily but she takes once daily and this has been stable over time.  No need for change at this moment if she is comfortable. -Need repeat lipids in end of June to follow dose change of Crestor to 10 mg daily.  Liver panel and lipids at that time, medication changes to follow as needed.  Total time of encounter: 30 minutes total time of encounter, including 20 minutes spent in face-to-face patient care  on the date of this encounter. This time includes coordination of care and counseling regarding above mentioned problem list. Remainder of non-face-to-face time involved reviewing chart documents/testing relevant to the patient encounter and documentation in the medical record. I have independently reviewed documentation from referring provider.   Weston Brass, MD, [facility]  [facility]   Shared Decision Making/Informed Consent:       Medication Adjustments/Labs and Tests Ordered: Current medicines are reviewed at length with the patient today.  Concerns regarding medicines are outlined above.   Orders Placed This Encounter  Procedures   Lipid panel   Hepatic function panel   Ambulatory referral to Pulmonology   EKG 12-Lead    No orders of the defined types were placed in this encounter.   Patient Instructions  Medication  Instructions:  No Changes In Medications at this time.  *If you need a refill on your cardiac medications before your next appointment, please call your pharmacy*  Lab Work  Please return for FASTING Blood Work in [place] 2024. No appointment needed, lab here at the office is open Monday-Friday from 8AM to 4PM and closed daily for lunch from 12:45-1:45.   If you have labs (blood work) drawn today and your tests are completely normal, you will receive your results only by: MyChart Message (if you have MyChart) OR A paper copy in the mail If you have any lab test that is abnormal or we need to change your treatment, we will call you to review the results.  Testing/Procedures: REFERRAL TO PULMONOLOGY FOR SLEEP STUDY- SOMEONE WILL CALL YOU TO GET SCHEDULED  Follow-Up: At [facility], you and your health needs are our priority.  As part of our continuing mission to provide you with exceptional heart care, we have created designated Provider Care Teams.  These Care Teams include your primary Cardiologist (physician) and Advanced Practice  Providers (APPs -  Physician Assistants and Nurse Practitioners) who all work together to provide you with the care you need, when you need it.  Your next appointment:   6 month(s)  Provider:   Dr. Jacques Navy

## 2022-09-01 ENCOUNTER — Other Ambulatory Visit: Payer: Self-pay | Admitting: Internal Medicine

## 2022-09-13 ENCOUNTER — Encounter: Payer: Self-pay | Admitting: Adult Health

## 2022-09-13 ENCOUNTER — Ambulatory Visit (INDEPENDENT_AMBULATORY_CARE_PROVIDER_SITE_OTHER): Payer: No Typology Code available for payment source | Admitting: Adult Health

## 2022-09-13 VITALS — BP 110/60 | HR 70 | Ht 63.0 in | Wt 200.4 lb

## 2022-09-13 DIAGNOSIS — R0683 Snoring: Secondary | ICD-10-CM | POA: Diagnosis not present

## 2022-09-13 NOTE — Progress Notes (Signed)
@Patient  ID: [name], female    DOB: 1966-01-24, 57 y.o.   MRN: [id number]  Chief Complaint  Patient presents with   Consult    Referring provider: Margaree Mackintosh, MD  HPI: 57 year old female seen for sleep consult Sep 13, 2022 for snoring, restless sleep, daytime sleepiness, witnessed apneic events  TEST/EVENTS :   09/13/2022 Sleep consult  Patient presents for a sleep consult today for snoring, restless sleep, daytime sleepiness and witnessed apneic events.  Patient says that she has had longstanding snoring that is interrupting her sleep and spouses sleep.  Her spouse has noticed some apneic events where she stops breathing throughout the night.  Feels tired during the daytime.  Does feel sleepy a lot especially if she sits down to rest or watch TV.  Typically goes to bed between 9:30 PM and 11 PM.  Goes to sleep very quickly within 5 minutes.  But is up 2 or 3 times each night.  Gets up about 7 AM.  Weight is up about 20 pounds over the last 2 years.  Current weight is at 200 pounds with a BMI of 35.  Has never had a sleep study before.  No history of congestive heart failure or stroke.  Does not use any sleep aids.  No symptoms suspicious for cataplexy or sleep paralysis.  Does not have any removable dental work.  Does not nap.  Has 1 cup of coffee daily. Does have family history of sleep apnea in her sister.  Epworth score is 14 out of 24.  Gets sleepy if she sits down to watch TV, rest, in the evening hours and after lunch.  Social history patient is married.  Works as a Engineer, structural.  Has adult children.  And grown children.  Is a never smoker.  No alcohol or drug use.  Family history is significant for sleep apnea, heart disease and leukemia.  Medical history significant for hypertension, hyperlipidemia, GERD and chronic allergies.  Past Surgical History:  Procedure Laterality Date   FOOT SURGERY Right 04/19/2021   LIPOMA EXCISION       Allergies  Allergen Reactions    Cephalexin Nausea And Vomiting   Pantoprazole Other (See Comments)    Headache    Immunization History  Administered Date(s) Administered   Influenza,inj,Quad PF,6+ Mos 02/12/2020, 02/17/2021, 02/22/2022   Influenza-Unspecified 02/06/2019   Tdap 05/09/2012    Past Medical History:  Diagnosis Date   Migraine     Tobacco History: Social History   Tobacco Use  Smoking Status Never  Smokeless Tobacco Never   Counseling given: Not Answered   Outpatient Medications Prior to Visit  Medication Sig Dispense Refill   acetaminophen (TYLENOL) 500 MG tablet Take 1,000 mg by mouth every 6 (six) hours as needed for moderate pain.     amLODipine (NORVASC) 5 MG tablet TAKE 1 TABLET(5 MG) BY MOUTH EVERY MORNING 90 tablet 1   aspirin 81 MG tablet Take 81 mg by mouth as needed.      b complex vitamins capsule Take 1 capsule by mouth as needed.     cholecalciferol (VITAMIN D) 1000 units tablet Take 1,000 Units by mouth daily.     hydrochlorothiazide (HYDRODIURIL) 25 MG tablet TAKE 1 TABLET(25 MG) BY MOUTH DAILY 90 tablet 1   ibuprofen (ADVIL,MOTRIN) 200 MG tablet Take 200 mg by mouth every 6 (six) hours as needed for moderate pain.      lansoprazole (PREVACID) 30 MG capsule Take 30 mg by mouth 2 (  two) times daily before a meal.     loratadine (CLARITIN) 10 MG tablet Take 10 mg by mouth daily.     metoprolol tartrate (LOPRESSOR) 25 MG tablet TAKE 1 TABLET(25 MG) BY MOUTH DAILY 90 tablet 1   naproxen sodium (ANAPROX) 220 MG tablet Take 440 mg by mouth 2 (two) times daily as needed (pain).     OVER THE COUNTER MEDICATION Take 1 capsule by mouth daily. Quercitine (for immunity)     Probiotic Product (PROBIOTIC-10 PO) Take by mouth.     rosuvastatin (CRESTOR) 10 MG tablet Take 1 tablet (10 mg total) by mouth daily. 90 tablet 1   triamcinolone cream (KENALOG) 0.1 % Apply 1 application. topically 2 (two) times daily. 30 g 0   Vitamin C-Vitamin D-Zinc (D3/VITAMIN C/ZINC PO) Take by mouth daily.      No facility-administered medications prior to visit.     Review of Systems:   Constitutional:   No  weight loss, night sweats,  Fevers, chills,  +fatigue, or  lassitude.  HEENT:   No headaches,  Difficulty swallowing,  Tooth/dental problems, or  Sore throat,                No sneezing, itching, ear ache, nasal congestion, post nasal drip,   CV:  No chest pain,  Orthopnea, PND, swelling in lower extremities, anasarca, dizziness, palpitations, syncope.   GI  No heartburn, indigestion, abdominal pain, nausea, vomiting, diarrhea, change in bowel habits, loss of appetite, bloody stools.   Resp: No shortness of breath with exertion or at rest.  No excess mucus, no productive cough,  No non-productive cough,  No coughing up of blood.  No change in color of mucus.  No wheezing.  No chest wall deformity  Skin: no rash or lesions.  GU: no dysuria, change in color of urine, no urgency or frequency.  No flank pain, no hematuria   MS:  No joint pain or swelling.  No decreased range of motion.  No back pain.    Physical Exam  BP 110/60 (BP Location: Left Arm, Patient Position: Sitting, Cuff Size: Large)   Pulse 70   Ht 5\' 3"  (1.6 m)   Wt 200 lb 6.4 oz (90.9 kg)   SpO2 96%   BMI 35.50 kg/m   GEN: A/Ox3; pleasant , NAD, well nourished    HEENT:  [place]/AT,  NOSE-clear, THROAT-clear, no lesions, no postnasal drip or exudate noted.  Class III MP airway  NECK:  Supple w/ fair ROM; no JVD; normal carotid impulses w/o bruits; no thyromegaly or nodules palpated; no lymphadenopathy.    RESP  Clear  P & A; w/o, wheezes/ rales/ or rhonchi. no accessory muscle use, no dullness to percussion  CARD:  RRR, no m/r/g, no peripheral edema, pulses intact, no cyanosis or clubbing.  GI:   Soft & nt; nml bowel sounds; no organomegaly or masses detected.   Musco: Warm bil, no deformities or joint swelling noted.   Neuro: alert, no focal deficits noted.    Skin: Warm, no lesions or rashes    Lab  Results:  CBC    Component Value Date/Time   WBC 5.9 07/21/2022 0408   RBC 4.42 07/21/2022 0408   HGB 13.3 07/21/2022 0408   HCT 39.4 07/21/2022 0408   PLT 222 07/21/2022 0408   MCV 89.1 07/21/2022 0408   MCH 30.1 07/21/2022 0408   MCHC 33.8 07/21/2022 0408   RDW 12.2 07/21/2022 0408   LYMPHSABS 1,574 02/16/2022 1209  MONOABS 0.5 04/08/2015 0433   EOSABS 190 02/16/2022 1209   BASOSABS 50 02/16/2022 1209    BMET    Component Value Date/Time   NA 138 07/21/2022 0408   K 3.5 07/21/2022 0408   CL 102 07/21/2022 0408   CO2 25 07/21/2022 0408   GLUCOSE 115 (H) 07/21/2022 0408   BUN 18 07/21/2022 0408   CREATININE 0.99 07/21/2022 0408   CREATININE 0.89 02/16/2022 1209   CALCIUM 9.1 07/21/2022 0408   GFRNONAA >60 07/21/2022 0408   GFRNONAA 74 02/09/2020 0913   GFRAA 86 02/09/2020 0913    BNP No results found for: "BNP"  ProBNP No results found for: "PROBNP"  Imaging: No results found.        No data to display          No results found for: "NITRICOXIDE"      Assessment & Plan:   Snoring Snoring, daytime sleepiness, restless sleep, witnessed apneic events are suspicious for underlying sleep apnea.  Patient education given on sleep apnea.  Will set patient up for home sleep study  - discussed how weight can impact sleep and risk for sleep disordered breathing - discussed options to assist with weight loss: combination of diet modification, cardiovascular and strength training exercises   - had an extensive discussion regarding the adverse health consequences related to untreated sleep disordered breathing - specifically discussed the risks for hypertension, coronary artery disease, cardiac dysrhythmias, cerebrovascular disease, and diabetes - lifestyle modification discussed   - discussed how sleep disruption can increase risk of accidents, particularly when driving - safe driving practices were discussed   Plan  Patient Instructions  Set up for  home sleep study  Work on healthy sleep regimen  Work on healthy weight.  Do not drive sleepy  Follow up in 6 weeks and As needed       Obesity Healthy weight loss discussed     Rubye Oaks, NP 09/13/2022

## 2022-09-13 NOTE — Assessment & Plan Note (Signed)
Healthy weight loss discussed 

## 2022-09-13 NOTE — Progress Notes (Signed)
Reviewed and agree with assessment/plan.   Coralyn Helling, MD [facility] 09/13/2022, 10:53 AM Pager:  [phone]

## 2022-09-13 NOTE — Patient Instructions (Signed)
Set up for home sleep study  Work on healthy sleep regimen  Work on healthy weight.  Do not drive sleepy  Follow up in 6 weeks and As needed

## 2022-09-13 NOTE — Assessment & Plan Note (Signed)
Snoring, daytime sleepiness, restless sleep, witnessed apneic events are suspicious for underlying sleep apnea.  Patient education given on sleep apnea.  Will set patient up for home sleep study  - discussed how weight can impact sleep and risk for sleep disordered breathing - discussed options to assist with weight loss: combination of diet modification, cardiovascular and strength training exercises   - had an extensive discussion regarding the adverse health consequences related to untreated sleep disordered breathing - specifically discussed the risks for hypertension, coronary artery disease, cardiac dysrhythmias, cerebrovascular disease, and diabetes - lifestyle modification discussed   - discussed how sleep disruption can increase risk of accidents, particularly when driving - safe driving practices were discussed   Plan  Patient Instructions  Set up for home sleep study  Work on healthy sleep regimen  Work on healthy weight.  Do not drive sleepy  Follow up in 6 weeks and As needed

## 2022-09-20 ENCOUNTER — Encounter: Payer: Self-pay | Admitting: Dietician

## 2022-09-20 ENCOUNTER — Encounter: Payer: No Typology Code available for payment source | Attending: Internal Medicine | Admitting: Dietician

## 2022-09-20 VITALS — Ht 63.0 in | Wt 201.0 lb

## 2022-09-20 DIAGNOSIS — R7302 Impaired glucose tolerance (oral): Secondary | ICD-10-CM | POA: Diagnosis present

## 2022-09-20 NOTE — Patient Instructions (Signed)
Aim for 150 minutes of physical activity weekly. Goal: go walking once a week for 20-30 minutes  Goal: Eat 3 meals per day.  At meals aim to include 1/4 plate complex carbs, 1/4 plate protein, 1/2 plate non-starchy vegetables.  Goal: Drink 2 of your Yeti's per day.   Aim to eat within 1-2 hours of waking up and every 3-5 hours following.   At snacks aim to include a complex carb and protein.

## 2022-09-20 NOTE — Progress Notes (Signed)
Medical Nutrition Therapy  Appointment Start time:  [phone]  Appointment End time:  1017  Primary concerns today: Pt states she feels like she knows what she should be doing but she needs accountability.    Referral diagnosis: impaired glucose tolerance Preferred learning style: no preference indicated Learning readiness: ready   NUTRITION ASSESSMENT   Anthropometrics  Ht: 63 in Wt: 201 lbs  Clinical Medical Hx: reviewed, prediabetes, HTN, high cholesterol Medications: reviewed Labs: 07/21/22 A1c 5.9% Notable Signs/Symptoms: none reported Food Allergies: none reported  Lifestyle & Dietary Hx Pt is getting a sleep study done for concerns of sleep apnea.   Pt states she used to eat breakfast but now she isn't as hungry.   Pt reports she is helping take care of her grandchildren and mom.   Pt states she is going to be starting a nutrition plan soon. ([place]).  Pt states over the last 15 years she's fallen 4 times and this has caused some issues with her ankle.  Pt states she is an emotional eater.   Estimated daily fluid intake: 8 oz plain water Supplements: vitamin D, b-complex, vitamin C/zinc/D, probiotic Sleep: concerns for sleep apnea Stress / self-care: moderate stress Current average weekly physical activity: ADLs  24-Hr Dietary Recall First Meal: bowl of cereal OR instant grits OR skips Snack: none Second Meal: 12pm: bean burrito OR chicken on salad OR hamburger Snack: snickers Third Meal: chicken, vegetables, and starch OR spaghetti OR eats out/picks up Snack: snickers Beverages: coffee, electrolyte drink, 8 oz water   NUTRITION DIAGNOSIS  [employer]-2.2 Altered nutrition-related laboratory As related to prediabetes.  As evidenced by A1c 5.9%.   NUTRITION INTERVENTION  Nutrition education (E-1) on the following topics:  Fruits & Vegetables: Aim to fill half your plate with a variety of fruits and vegetables. They are rich in vitamins, minerals, and fiber, and can  help reduce the risk of chronic diseases. Choose a colorful assortment of fruits and vegetables to ensure you get a wide range of nutrients. Grains and Starches: Make at least half of your grain choices whole grains, such as brown rice, whole wheat bread, and oats. Whole grains provide fiber, which aids in digestion and healthy cholesterol levels. Aim for whole forms of starchy vegetables such as potatoes, sweet potatoes, beans, peas, and corn, which are fiber rich and provide many vitamins and minerals.  Protein: Incorporate lean sources of protein, such as poultry, fish, beans, nuts, and seeds, into your meals. Protein is essential for building and repairing tissues, staying full, balancing blood sugar, as well as supporting immune function. Dairy: Include low-fat or fat-free dairy products like milk, yogurt, and cheese in your diet. Dairy foods are excellent sources of calcium and vitamin D, which are crucial for bone health.  Physical Activity: Aim for 60 minutes of physical activity daily. Regular physical activity promotes overall health-including helping to reduce risk for heart disease and diabetes, promoting mental health, and helping [place] sleep better.  Prediabetes: Prediabetes is a condition where blood sugar levels are higher than normal but not yet high enough to be diagnosed as type 2 diabetes. A1C, or hemoglobin A1c, is a blood test that provides an average of a person's blood sugar levels over the past two to three months. It is commonly used to diagnose and monitor diabetes. For prediabetes, an A1C level between 5.7% and 6.4% typically is used to diagnose this. Here is how the A1C levels are generally categorized: Normal:  A1C below 5.7% Prediabetes:  A1C between  5.7% and 6.4% Diabetes:  A1C of 6.5% or higher When diagnosed with prediabetes, there are several lifestyle changes you can make to manage the condition: Healthy Eating:  Follow a well-balanced diet that includes a variety of  fruits, vegetables, whole grains, lean proteins, and healthy fats. Monitor portion sizes and reduce intake of sugary and processed foods. Include strength training exercises at least twice a week. Weight Management: Achieve and maintain a healthy weight. Losing even a small amount of weight (3-5%) can significantly improve insulin sensitivity.  Handouts Provided Include  Balanced Snacks Plate Method  Learning Style & Readiness for Change Teaching method utilized: Visual & Auditory  Demonstrated degree of understanding via: Teach Back  Barriers to learning/adherence to lifestyle change: none  Goals Established by Pt Aim for 150 minutes of physical activity weekly. Goal: go walking once a week for 20-30 minutes  Goal: Eat 3 meals per day.  At meals aim to include 1/4 plate complex carbs, 1/4 plate protein, 1/2 plate non-starchy vegetables.  Goal: Drink 2 of your Yeti's per day.   Aim to eat within 1-2 hours of waking up and every 3-5 hours following.   At snacks aim to include a complex carb and protein.   MONITORING & EVALUATION Dietary intake, weekly physical activity, and follow up in 2 months.  Next Steps  Patient is to call for questions.

## 2022-10-17 ENCOUNTER — Ambulatory Visit: Payer: No Typology Code available for payment source

## 2022-10-17 DIAGNOSIS — G4733 Obstructive sleep apnea (adult) (pediatric): Secondary | ICD-10-CM | POA: Diagnosis not present

## 2022-10-17 DIAGNOSIS — R0683 Snoring: Secondary | ICD-10-CM

## 2022-10-25 ENCOUNTER — Encounter: Payer: Self-pay | Admitting: Adult Health

## 2022-10-25 ENCOUNTER — Ambulatory Visit: Payer: No Typology Code available for payment source | Admitting: Adult Health

## 2022-10-25 VITALS — BP 110/84 | HR 62 | Ht 63.0 in | Wt 200.2 lb

## 2022-10-25 DIAGNOSIS — G4733 Obstructive sleep apnea (adult) (pediatric): Secondary | ICD-10-CM

## 2022-10-25 NOTE — Progress Notes (Signed)
@Patient  ID: [name], female    DOB: 1965-08-27, 57 y.o.   MRN: [id number]  Chief Complaint  Patient presents with   Follow-up    Referring provider: Margaree Mackintosh, MD  HPI: 57 year old female seen for sleep consult Sep 13, 2022 for snoring and daytime sleepiness found to have moderate obstructive sleep apnea  TEST/EVENTS :  Home sleep study September 29, 2022 showed moderate sleep apnea with an AHI at 26/hour and SpO2 low at 74%  10/25/2022 Follow up ; OSA  Patient presents for a 6-week follow-up.  Patient was seen last visit for sleep consult for snoring and daytime sleepiness.  She was set up for home sleep study that was done on September 29, 2022 that showed moderate sleep apnea with AHI at 26/hour and SpO2 low at 74%.  We discussed her sleep study results in detail.  We went over treatment options including weight loss, oral appliance and CPAP therapy.  Patient would like to proceed with CPAP therapy.  Allergies  Allergen Reactions   Cephalexin Nausea And Vomiting   Pantoprazole Other (See Comments)    Headache    Immunization History  Administered Date(s) Administered   Influenza,inj,Quad PF,6+ Mos 02/12/2020, 02/17/2021, 02/22/2022   Influenza-Unspecified 02/06/2019   Tdap 05/09/2012    Past Medical History:  Diagnosis Date   Migraine     Tobacco History: Social History   Tobacco Use  Smoking Status Never  Smokeless Tobacco Never   Counseling given: Not Answered   Outpatient Medications Prior to Visit  Medication Sig Dispense Refill   acetaminophen (TYLENOL) 500 MG tablet Take 1,000 mg by mouth every 6 (six) hours as needed for moderate pain.     amLODipine (NORVASC) 5 MG tablet TAKE 1 TABLET(5 MG) BY MOUTH EVERY MORNING 90 tablet 1   aspirin 81 MG tablet Take 81 mg by mouth as needed.      b complex vitamins capsule Take 1 capsule by mouth as needed.     cholecalciferol (VITAMIN D) 1000 units tablet Take 1,000 Units by mouth daily.     hydrochlorothiazide  (HYDRODIURIL) 25 MG tablet TAKE 1 TABLET(25 MG) BY MOUTH DAILY 90 tablet 1   ibuprofen (ADVIL,MOTRIN) 200 MG tablet Take 200 mg by mouth every 6 (six) hours as needed for moderate pain.      lansoprazole (PREVACID) 30 MG capsule Take 30 mg by mouth 2 (two) times daily before a meal.     loratadine (CLARITIN) 10 MG tablet Take 10 mg by mouth daily.     metoprolol tartrate (LOPRESSOR) 25 MG tablet TAKE 1 TABLET(25 MG) BY MOUTH DAILY 90 tablet 1   naproxen sodium (ANAPROX) 220 MG tablet Take 440 mg by mouth 2 (two) times daily as needed (pain).     OVER THE COUNTER MEDICATION Take 1 capsule by mouth daily. Quercitine (for immunity)     Probiotic Product (PROBIOTIC-10 PO) Take by mouth.     rosuvastatin (CRESTOR) 10 MG tablet Take 1 tablet (10 mg total) by mouth daily. 90 tablet 1   triamcinolone cream (KENALOG) 0.1 % Apply 1 application. topically 2 (two) times daily. 30 g 0   Vitamin C-Vitamin D-Zinc (D3/VITAMIN C/ZINC PO) Take by mouth daily.     No facility-administered medications prior to visit.     Review of Systems:   Constitutional:   No  weight loss, night sweats,  Fevers, chills, + fatigue, or  lassitude.  HEENT:   No headaches,  Difficulty swallowing,  Tooth/dental problems,  or  Sore throat,                No sneezing, itching, ear ache, nasal congestion, post nasal drip,   CV:  No chest pain,  Orthopnea, PND, swelling in lower extremities, anasarca, dizziness, palpitations, syncope.   GI  No heartburn, indigestion, abdominal pain, nausea, vomiting, diarrhea, change in bowel habits, loss of appetite, bloody stools.   Resp: No shortness of breath with exertion or at rest.  No excess mucus, no productive cough,  No non-productive cough,  No coughing up of blood.  No change in color of mucus.  No wheezing.  No chest wall deformity  Skin: no rash or lesions.  GU: no dysuria, change in color of urine, no urgency or frequency.  No flank pain, no hematuria   MS:  No joint pain or  swelling.  No decreased range of motion.  No back pain.    Physical Exam  BP 110/84 (BP Location: Left Arm, Patient Position: Sitting, Cuff Size: Large)   Pulse 62   Ht 5\' 3"  (1.6 m)   Wt 200 lb 3.2 oz (90.8 kg)   SpO2 95%   BMI 35.46 kg/m   GEN: A/Ox3; pleasant , NAD, well nourished    HEENT:  [place]/AT,  NOSE-clear, THROAT-clear, no lesions, no postnasal drip or exudate noted.  Class III MP airway  NECK:  Supple w/ fair ROM; no JVD; normal carotid impulses w/o bruits; no thyromegaly or nodules palpated; no lymphadenopathy.    RESP  Clear  P & A; w/o, wheezes/ rales/ or rhonchi. no accessory muscle use, no dullness to percussion  CARD:  RRR, no m/r/g, no peripheral edema, pulses intact, no cyanosis or clubbing.  GI:   Soft & nt; nml bowel sounds; no organomegaly or masses detected.   Musco: Warm bil, no deformities or joint swelling noted.   Neuro: alert, no focal deficits noted.    Skin: Warm, no lesions or rashes    Lab Results:    BMET   BNP No results found for: "BNP"  ProBNP No results found for: "PROBNP"  Imaging: No results found.        No data to display          No results found for: "NITRICOXIDE"      Assessment & Plan:   OSA (obstructive sleep apnea) Moderate obstructive sleep apnea.  Patient education given on sleep apnea.  We went over treatment options..  Patient will proceed with CPAP therapy with CPAP auto 5 to 15 cm H2O.  Mask of choice.  - discussed how weight can impact sleep and risk for sleep disordered breathing - discussed options to assist with weight loss: combination of diet modification, cardiovascular and strength training exercises   - had an extensive discussion regarding the adverse health consequences related to untreated sleep disordered breathing - specifically discussed the risks for hypertension, coronary artery disease, cardiac dysrhythmias, cerebrovascular disease, and diabetes - lifestyle modification  discussed   - discussed how sleep disruption can increase risk of accidents, particularly when driving - safe driving practices were discussed   Plan  Patient Instructions  Begin CPAP At bedtime, wear all night long for at least 6hr or more  Work on healthy sleep regimen  Work on healthy weight.  Do not drive sleepy  Follow up in 3 months and As needed        Rubye Oaks, NP 10/25/2022

## 2022-10-25 NOTE — Patient Instructions (Addendum)
Begin CPAP At bedtime, wear all night long for at least 6hr or more  Work on healthy sleep regimen  Work on healthy weight.  Do not drive sleepy  Follow up in 3 months and As needed

## 2022-10-25 NOTE — Assessment & Plan Note (Signed)
Moderate obstructive sleep apnea.  Patient education given on sleep apnea.  We went over treatment options..  Patient will proceed with CPAP therapy with CPAP auto 5 to 15 cm H2O.  Mask of choice.  - discussed how weight can impact sleep and risk for sleep disordered breathing - discussed options to assist with weight loss: combination of diet modification, cardiovascular and strength training exercises   - had an extensive discussion regarding the adverse health consequences related to untreated sleep disordered breathing - specifically discussed the risks for hypertension, coronary artery disease, cardiac dysrhythmias, cerebrovascular disease, and diabetes - lifestyle modification discussed   - discussed how sleep disruption can increase risk of accidents, particularly when driving - safe driving practices were discussed   Plan  Patient Instructions  Begin CPAP At bedtime, wear all night long for at least 6hr or more  Work on healthy sleep regimen  Work on healthy weight.  Do not drive sleepy  Follow up in 3 months and As needed

## 2022-10-27 NOTE — Progress Notes (Signed)
Reviewed and agree with assessment/plan.   Hanifa Antonetti, MD [facility] 10/27/2022, 7:16 AM Pager:  [phone]  

## 2022-11-16 ENCOUNTER — Ambulatory Visit: Payer: No Typology Code available for payment source | Admitting: Dietician

## 2022-12-13 ENCOUNTER — Encounter: Payer: No Typology Code available for payment source | Attending: Internal Medicine | Admitting: Dietician

## 2022-12-13 ENCOUNTER — Encounter: Payer: Self-pay | Admitting: Dietician

## 2022-12-13 DIAGNOSIS — R7302 Impaired glucose tolerance (oral): Secondary | ICD-10-CM | POA: Insufficient documentation

## 2022-12-13 NOTE — Patient Instructions (Signed)
Previous Goal: go walking once a week for 20-30 minutes - pt wants to change goal New Goal: go walking 1-3 times per week for 20-30 minutes.   Previous Goal: Eat 3 meals per day. - pt wants to change goal New Goal: include vegetables with 2 meals per day.  (At meals aim to include 1/4 plate complex carbs, 1/4 plate protein, 1/2 plate non-starchy vegetables.)  Goal: Drink 2 of your Yeti's per day. - goal in progress, continue!!

## 2022-12-13 NOTE — Progress Notes (Signed)
Medical Nutrition Therapy  Appointment Start time:  [phone]  Appointment End time:  0845  Primary concerns today: Pt states she feels like she knows what she should be doing but she needs accountability.    Referral diagnosis: impaired glucose tolerance Preferred learning style: no preference indicated Learning readiness: ready   NUTRITION ASSESSMENT   Anthropometrics  Ht: 63 in Wt 12/13/22: pt declined Wt 09/20/22: 201 lbs  Clinical Medical Hx: reviewed, prediabetes, HTN, high cholesterol Medications: reviewed Labs: 07/21/22 A1c 5.9% Notable Signs/Symptoms: none reported Food Allergies: none reported  Lifestyle & Dietary Hx  Pt states she did the sleep study and has sleep apnea and got c-pap and states sometimes she falls asleep before putting it on.   Pt states she is an emotional eating and feels like she eats differently when she is off schedule. She states she feels like she doesn't have a set routine.   Pt states she hasn't been walking much and has only walked a few times.   Pt watches her 3 grandsons. She states she had 1 grandson born since previous visit and she is feeling more settled now that grandson was born.   Pt states she has increased water intake from 8 oz to 16 oz.   Estimated daily fluid intake: 16 oz plain water Supplements: vitamin D, b-complex, vitamin C/zinc/D, probiotic Sleep: concerns for sleep apnea Stress / self-care: moderate stress Current average weekly physical activity: ADLs  24-Hr Dietary Recall First Meal: arbonne shake Snack: none Second Meal: 12pm: chickfila nuggets and kale salad Snack: snickers Third Meal: chicken, vegetables, and starch OR spaghetti OR eats out/picks up Snack: snickers Beverages: coffee, electrolyte drink, 8 oz water   NUTRITION DIAGNOSIS  Green-2.2 Altered nutrition-related laboratory As related to prediabetes.  As evidenced by A1c 5.9%.   NUTRITION INTERVENTION  Nutrition education (E-1) on the following  topics:  Fruits & Vegetables: Aim to fill half your plate with a variety of fruits and vegetables. They are rich in vitamins, minerals, and fiber, and can help reduce the risk of chronic diseases. Choose a colorful assortment of fruits and vegetables to ensure you get a wide range of nutrients. Grains and Starches: Make at least half of your grain choices whole grains, such as brown rice, whole wheat bread, and oats. Whole grains provide fiber, which aids in digestion and healthy cholesterol levels. Aim for whole forms of starchy vegetables such as potatoes, sweet potatoes, beans, peas, and corn, which are fiber rich and provide many vitamins and minerals.  Protein: Incorporate lean sources of protein, such as poultry, fish, beans, nuts, and seeds, into your meals. Protein is essential for building and repairing tissues, staying full, balancing blood sugar, as well as supporting immune function. Dairy: Include low-fat or fat-free dairy products like milk, yogurt, and cheese in your diet. Dairy foods are excellent sources of calcium and vitamin D, which are crucial for bone health.  Physical Activity: Aim for 60 minutes of physical activity daily. Regular physical activity promotes overall health-including helping to reduce risk for heart disease and diabetes, promoting mental health, and helping [place] sleep better.  Prediabetes: Prediabetes is a condition where blood sugar levels are higher than normal but not yet high enough to be diagnosed as type 2 diabetes. A1C, or hemoglobin A1c, is a blood test that provides an average of a person's blood sugar levels over the past two to three months. It is commonly used to diagnose and monitor diabetes. For prediabetes, an A1C level between 5.7%  and 6.4% typically is used to diagnose this. Here is how the A1C levels are generally categorized: Normal:  A1C below 5.7% Prediabetes:  A1C between 5.7% and 6.4% Diabetes:  A1C of 6.5% or higher When diagnosed with  prediabetes, there are several lifestyle changes you can make to manage the condition: Healthy Eating:  Follow a well-balanced diet that includes a variety of fruits, vegetables, whole grains, lean proteins, and healthy fats. Monitor portion sizes and reduce intake of sugary and processed foods. Include strength training exercises at least twice a week. Weight Management: Achieve and maintain a healthy weight. Losing even a small amount of weight (3-5%) can significantly improve insulin sensitivity.  Handouts Provided Include  No handouts provided on this follow up  Learning Style & Readiness for Change Teaching method utilized: Visual & Auditory  Demonstrated degree of understanding via: Teach Back  Barriers to learning/adherence to lifestyle change: none  Goals Established by Pt  Goal: go walking once a week for 20-30 minutes - pt wants to change goal New Goal: go walking 1-3 times per week for 20-30 minutes.   Goal: Eat 3 meals per day. - pt wants to change goal New Goal: include vegetables with 2 meals per day.  At meals aim to include 1/4 plate complex carbs, 1/4 plate protein, 1/2 plate non-starchy vegetables.  Goal: Drink 2 of your Yeti's per day. - goal in progress, continue!!   MONITORING & EVALUATION Dietary intake, weekly physical activity, and follow up in 2 months.  Next Steps  Patient is to call for questions.

## 2023-01-25 ENCOUNTER — Ambulatory Visit: Payer: No Typology Code available for payment source | Admitting: Adult Health

## 2023-01-29 ENCOUNTER — Encounter: Payer: Self-pay | Admitting: Adult Health

## 2023-01-29 ENCOUNTER — Ambulatory Visit: Payer: No Typology Code available for payment source | Admitting: Adult Health

## 2023-01-29 VITALS — BP 110/70 | HR 59 | Ht 63.0 in | Wt 202.0 lb

## 2023-01-29 DIAGNOSIS — G4733 Obstructive sleep apnea (adult) (pediatric): Secondary | ICD-10-CM | POA: Diagnosis not present

## 2023-01-29 DIAGNOSIS — Z23 Encounter for immunization: Secondary | ICD-10-CM | POA: Diagnosis not present

## 2023-01-29 NOTE — Patient Instructions (Addendum)
Continue on CPAP At bedtime, wear all night long for at least 6hr or more  Change CPAP pressure to auto CPAP 5-12cmH2O.  May try Dream wear nasal mask.  Work on healthy sleep regimen  Work on healthy weight.  Do not drive sleepy  Flu shot today .  Follow up in 6 months and As needed

## 2023-01-29 NOTE — Progress Notes (Unsigned)
@Patient  ID: [name], female    DOB: 1965/12/07, 57 y.o.   MRN: [id number]  Chief Complaint  Patient presents with   Follow-up    Referring provider: Margaree Mackintosh, MD  HPI:   TEST/EVENTS :    01/29/2023   Doing good  Feeling better on cpap   Allergies  Allergen Reactions   Cephalexin Nausea And Vomiting   Pantoprazole Other (See Comments)    Headache    Immunization History  Administered Date(s) Administered   Influenza,inj,Quad PF,6+ Mos 02/12/2020, 02/17/2021, 02/22/2022   Influenza-Unspecified 02/06/2019   Tdap 05/09/2012    Past Medical History:  Diagnosis Date   Migraine     Tobacco History: Social History   Tobacco Use  Smoking Status Never  Smokeless Tobacco Never   Counseling given: Not Answered   Outpatient Medications Prior to Visit  Medication Sig Dispense Refill   acetaminophen (TYLENOL) 500 MG tablet Take 1,000 mg by mouth every 6 (six) hours as needed for moderate pain.     amLODipine (NORVASC) 5 MG tablet TAKE 1 TABLET(5 MG) BY MOUTH EVERY MORNING 90 tablet 1   aspirin 81 MG tablet Take 81 mg by mouth as needed.      b complex vitamins capsule Take 1 capsule by mouth as needed.     cholecalciferol (VITAMIN D) 1000 units tablet Take 1,000 Units by mouth daily.     hydrochlorothiazide (HYDRODIURIL) 25 MG tablet TAKE 1 TABLET(25 MG) BY MOUTH DAILY 90 tablet 1   ibuprofen (ADVIL,MOTRIN) 200 MG tablet Take 200 mg by mouth every 6 (six) hours as needed for moderate pain.      lansoprazole (PREVACID) 30 MG capsule Take 30 mg by mouth 2 (two) times daily before a meal.     loratadine (CLARITIN) 10 MG tablet Take 10 mg by mouth daily.     metoprolol tartrate (LOPRESSOR) 25 MG tablet TAKE 1 TABLET(25 MG) BY MOUTH DAILY 90 tablet 1   naproxen sodium (ANAPROX) 220 MG tablet Take 440 mg by mouth 2 (two) times daily as needed (pain).     OVER THE COUNTER MEDICATION Take 1 capsule by mouth daily. Quercitine (for immunity)     Probiotic  Product (PROBIOTIC-10 PO) Take by mouth.     Quercetin 500 MG CAPS Take 500 mg by mouth daily.     triamcinolone cream (KENALOG) 0.1 % Apply 1 application. topically 2 (two) times daily. 30 g 0   Vitamin C-Vitamin D-Zinc (D3/VITAMIN C/ZINC PO) Take by mouth daily.     rosuvastatin (CRESTOR) 10 MG tablet Take 1 tablet (10 mg total) by mouth daily. 90 tablet 1   No facility-administered medications prior to visit.     Review of Systems:   Constitutional:   No  weight loss, night sweats,  Fevers, chills, fatigue, or  lassitude.  HEENT:   No headaches,  Difficulty swallowing,  Tooth/dental problems, or  Sore throat,                No sneezing, itching, ear ache, nasal congestion, post nasal drip,   CV:  No chest pain,  Orthopnea, PND, swelling in lower extremities, anasarca, dizziness, palpitations, syncope.   GI  No heartburn, indigestion, abdominal pain, nausea, vomiting, diarrhea, change in bowel habits, loss of appetite, bloody stools.   Resp: No shortness of breath with exertion or at rest.  No excess mucus, no productive cough,  No non-productive cough,  No coughing up of blood.  No change in color of  mucus.  No wheezing.  No chest wall deformity  Skin: no rash or lesions.  GU: no dysuria, change in color of urine, no urgency or frequency.  No flank pain, no hematuria   MS:  No joint pain or swelling.  No decreased range of motion.  No back pain.    Physical Exam  BP 110/70 (BP Location: Left Arm, Patient Position: Sitting, Cuff Size: Large)   Pulse (!) 59   Ht 5\' 3"  (1.6 m)   Wt 202 lb (91.6 kg)   SpO2 96%   BMI 35.78 kg/m   GEN: A/Ox3; pleasant , NAD, well nourished    HEENT:  [place]/AT,  EACs-clear, TMs-wnl, NOSE-clear, THROAT-clear, no lesions, no postnasal drip or exudate noted.   NECK:  Supple w/ fair ROM; no JVD; normal carotid impulses w/o bruits; no thyromegaly or nodules palpated; no lymphadenopathy.    RESP  Clear  P & A; w/o, wheezes/ rales/ or rhonchi. no  accessory muscle use, no dullness to percussion  CARD:  RRR, no m/r/g, no peripheral edema, pulses intact, no cyanosis or clubbing.  GI:   Soft & nt; nml bowel sounds; no organomegaly or masses detected.   Musco: Warm bil, no deformities or joint swelling noted.   Neuro: alert, no focal deficits noted.    Skin: Warm, no lesions or rashes    Lab Results:  CBC    Component Value Date/Time   WBC 5.9 07/21/2022 0408   RBC 4.42 07/21/2022 0408   HGB 13.3 07/21/2022 0408   HCT 39.4 07/21/2022 0408   PLT 222 07/21/2022 0408   MCV 89.1 07/21/2022 0408   MCH 30.1 07/21/2022 0408   MCHC 33.8 07/21/2022 0408   RDW 12.2 07/21/2022 0408   LYMPHSABS 1,574 02/16/2022 1209   MONOABS 0.5 04/08/2015 0433   EOSABS 190 02/16/2022 1209   BASOSABS 50 02/16/2022 1209    BMET    Component Value Date/Time   NA 138 07/21/2022 0408   K 3.5 07/21/2022 0408   CL 102 07/21/2022 0408   CO2 25 07/21/2022 0408   GLUCOSE 115 (H) 07/21/2022 0408   BUN 18 07/21/2022 0408   CREATININE 0.99 07/21/2022 0408   CREATININE 0.89 02/16/2022 1209   CALCIUM 9.1 07/21/2022 0408   GFRNONAA >60 07/21/2022 0408   GFRNONAA 74 02/09/2020 0913   GFRAA 86 02/09/2020 0913    BNP No results found for: "BNP"  ProBNP No results found for: "PROBNP"  Imaging: No results found.  Administration History     None           No data to display          No results found for: "NITRICOXIDE"      Assessment & Plan:   No problem-specific Assessment & Plan notes found for this encounter.     Rubye Oaks, NP 01/29/2023

## 2023-01-30 NOTE — Assessment & Plan Note (Addendum)
Moderate obstructive sleep apnea with perceived benefit on CPAP.  Patient's increase daily usage for greater than 6 hours if possible.  Continue on CPAP each night.  CPAP care was discussed.  Will adjust CPAP pressure for comfort.  Change to auto CPAP 5 to 12 cm H2O. Will also switch to the DreamWear nasal mask if she wants something smaller and more comfortable when she is sleeping  Plan  Patient Instructions  Continue on CPAP At bedtime, wear all night long for at least 6hr or more  Change CPAP pressure to auto CPAP 5-12cmH2O.  May try Dream wear nasal mask.  Work on healthy sleep regimen  Work on healthy weight.  Do not drive sleepy  Flu shot today .  Follow up in 6 months and As needed

## 2023-02-12 ENCOUNTER — Telehealth: Payer: Self-pay | Admitting: Adult Health

## 2023-02-12 NOTE — Telephone Encounter (Signed)
PT calling because the "Dreamwear" mask order to the provider was not specific enough. Apparently they're are two types of nasal masks for this type. One has the a nasal insert that goes in the nose. The other has a nasal piece that sits below the nose.  Pt says Tammy recommend  this mask for her for better comfort and no leaks but she is not sure which one to tell provider she needed. Please resubmit order accordingly and call pt to advise action taken.   PT # is [phone]

## 2023-02-12 NOTE — Telephone Encounter (Signed)
It is dreamwear Nasal -   Not nasal pillows

## 2023-02-13 NOTE — Telephone Encounter (Signed)
Correct order sen to [facility] 01/29/2023. Order state dream wear nasal mask.  Lm for patient.    PCC's, can you assist with this?

## 2023-02-14 NOTE — Telephone Encounter (Signed)
Tammy, please verify if dream wear nasal mask is the brand name? I was under the impression that dream wear is the brand name?

## 2023-02-14 NOTE — Telephone Encounter (Signed)
Can we put the brand name and they are claiming not specific  enough

## 2023-02-15 NOTE — Telephone Encounter (Signed)
Spoke with patient , dreamwear nasal (not pillows)

## 2023-02-15 NOTE — Telephone Encounter (Signed)
Spoke to patient. She stated that [facility] mentioned that there are two different nasal mask. There is one that goes in nose and one that goes under the nose.   Tammy, please advise. Thanks

## 2023-02-15 NOTE — Telephone Encounter (Signed)
Patient is trying to return missed call. She is trying to figure out if she needs to order the one that goes in her nose or over her nose. She is calling wanting to know if Babette Relic has found out yet. Please call back and advise.

## 2023-02-19 ENCOUNTER — Other Ambulatory Visit: Payer: No Typology Code available for payment source

## 2023-02-19 DIAGNOSIS — Z0000 Encounter for general adult medical examination without abnormal findings: Secondary | ICD-10-CM

## 2023-02-19 DIAGNOSIS — R7302 Impaired glucose tolerance (oral): Secondary | ICD-10-CM

## 2023-02-19 DIAGNOSIS — I10 Essential (primary) hypertension: Secondary | ICD-10-CM

## 2023-02-19 DIAGNOSIS — E782 Mixed hyperlipidemia: Secondary | ICD-10-CM

## 2023-02-19 NOTE — Progress Notes (Signed)
Patient Care Team: Margaree Mackintosh, MD as PCP - General (Internal Medicine) Parke Poisson, MD as PCP - Cardiology (Cardiology)  Visit Date: 02/26/23  Subjective:    Patient ID: [name] , Female   DOB: 11/20/1965, 57 y.o.    MRN: [id number]   57 y.o. Female presents today for annual comprehensive physical exam. History of migraine headaches, hyperlipidemia, hypertension, obstructive sleep apnea, mild impaired glucose tolerance.  History of hyperlipidemia treated with rosuvastatin 10 mg daily. Lipid panel normal.  History of hypertension treated with amlodipine 5 mg daily, metoprolol tartrate 25 mg daily, hydrochlorothiazide 25 mg daily. Blood pressure normal in-office today at 100/70.  History of impaired glucose tolerance. HGBA1c at 5.8% on 02/19/23, down from 5.9% seven months ago.  History of moderate obstructive sleep apnea. Seen by Rubye Oaks NP and placed on CPAP treatment on 10/25/22.  Status post right second metatarsal osteotomy, right second and third hammertoe repair in 12/22 and is doing well.  Admitted to hospital for unstable angina from 07/20/22 - 07/21/22. EKG without ischemic EKG changes including T wave inversions or ST segment changes. Chest X-ray unremarkable. 07/21/22 coronary CT showed: 1) Coronary calcium score of 5. 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 BMP and CBC unremarkable.  Going to PT for her neck. Has been seen at [facility] by Dr. Shon Baton. Treated initially with  5 mg Prednisone dose pack and Gabapentin 300 mg at bedtime. Subsequently prescribed Mobic. She feels she is getting better.  Review of Dr. 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  History of palpitations in  July 2019.  At that time blood pressure was elevated.  EKG was normal without ectopy.   History of migraine headaches.  History of cervical cryotherapy as an outpatient in 1991 left lower lobe pneumonia 2004.  Basal cell carcinoma removed from right forehead in 2006.  Benign right breast biopsy and lipoma removed from right shoulder 1993.   History of GE reflux treated with OTC omeprazole once daily.  Labs reviewed today. Glucose normal. Kidney, liver functions normal. Electrolytes normal. Blood proteins normal. CBC normal. TSH at 2.22.  Pap smear last completed 02/17/21. Showed atypical squamous cells of undetermined significance.  Mammogram normal on 02/20/22.  Colonoscopy due again in 2030.  07/21/22 CT coronary calcium scoring showed coronary calcium score of 5.   Social history: Married with 4 adult children and 2 grandchildren.  Non-smoker.  Looks after her mother who lives independently.  FamilyHx: Mother has macular degeneration and no longer drives.   Past Medical History:  Diagnosis Date   Migraine      Family History  Problem Relation Age of Onset   Heart disease Mother    Cancer Father     Social History   Social History Narrative   Not on file      Review of Systems  Constitutional:  Negative for chills, fever, malaise/fatigue and weight loss.  HENT:  Negative for hearing loss, sinus pain and sore throat.   Respiratory:  Negative for cough, hemoptysis and shortness of breath.   Cardiovascular:  Negative for chest pain, palpitations, leg swelling and PND.  Gastrointestinal:  Negative for abdominal pain, constipation, diarrhea, heartburn, nausea and vomiting.  Genitourinary:  Negative for dysuria, frequency and urgency.  Musculoskeletal:  Negative for back pain, myalgias and neck pain.  Skin:  Negative for itching and rash.  Neurological:  Negative for dizziness, tingling, seizures and headaches.  Endo/Heme/Allergies:  Negative for polydipsia.   Psychiatric/Behavioral:  Negative for depression. The patient is not nervous/anxious.         Objective:   Vitals: BP 100/70   Pulse 62   Ht 5' 0.75" (1.543 m)   Wt 201 lb (91.2 kg)   SpO2 97%   BMI 38.29 kg/m    Physical Exam Vitals and nursing note reviewed.  Constitutional:      General: She is not in acute distress.    Appearance: Normal appearance. She is not ill-appearing or toxic-appearing.  HENT:     Head: Normocephalic and atraumatic.     Right Ear: Hearing, tympanic membrane, ear canal and external ear normal.     Left Ear: Hearing, tympanic membrane, ear canal and external ear normal.     Mouth/Throat:     Pharynx: Oropharynx is clear.  Eyes:     Extraocular Movements: Extraocular movements intact.     Pupils: Pupils are equal, round, and reactive to light.  Neck:     Thyroid: No thyroid mass, thyromegaly or thyroid tenderness.     Vascular: No carotid bruit.  Cardiovascular:     Rate and Rhythm: Normal rate and regular rhythm. No extrasystoles are present.    Pulses:          Dorsalis pedis pulses are 2+ on the right side and 2+ on the left side.     Heart sounds: Normal heart sounds. No murmur heard.    No friction rub. No gallop.  Pulmonary:     Effort: Pulmonary effort is normal.     Breath sounds: Normal breath sounds. No decreased breath sounds, wheezing, rhonchi or rales.  Chest:     Chest wall: No mass.  Abdominal:     Palpations: Abdomen is soft. There is no hepatomegaly, splenomegaly or mass.     Tenderness: There is no abdominal tenderness.     Hernia: No hernia is present.  Musculoskeletal:     Cervical back: Normal range of motion.     Right lower leg: No edema.     Left lower leg: No edema.  Lymphadenopathy:     Cervical: No cervical adenopathy.     Upper Body:     Right upper body: No supraclavicular adenopathy.     Left upper body: No supraclavicular adenopathy.  Skin:    General: Skin is warm and dry.  Neurological:      General: No focal deficit present.     Mental Status: She is alert and oriented to person, place, and time. Mental status is at baseline.     Sensory: Sensation is intact.     Motor: Motor function is intact. No weakness.     Deep Tendon Reflexes: Reflexes are normal and symmetric.  Psychiatric:        Attention and Perception: Attention normal.        Mood and Affect: Mood normal.        Speech: Speech normal.        Behavior: Behavior normal.        Thought Content: Thought content normal.        Cognition and Memory: Cognition normal.        Judgment: Judgment normal.       Results:   Studies obtained and personally reviewed by me:  Pap smear last completed 02/17/21. Showed atypical squamous cells of undetermined significance.  Mammogram normal on 02/20/22.  Colonoscopy due again in 2030.  Labs:       Component Value Date/Time   NA 139 02/19/2023 0915   K 3.8 02/19/2023 0915   CL 101 02/19/2023 0915   CO2 28 02/19/2023 0915   GLUCOSE 99 02/19/2023 0915   BUN 14 02/19/2023 0915   CREATININE 0.89 02/19/2023 0915   CALCIUM 9.4 02/19/2023 0915   PROT 6.6 02/19/2023 0915   ALBUMIN 3.8 07/21/2022 0408   AST 28 02/19/2023 0915   ALT 28 02/19/2023 0915   ALKPHOS 65 07/21/2022 0408   BILITOT 0.6 02/19/2023 0915   GFRNONAA >60 07/21/2022 0408   GFRNONAA 74 02/09/2020 0913   GFRAA 86 02/09/2020 0913     Lab Results  Component Value Date   WBC 4.9 02/19/2023   HGB 13.7 02/19/2023   HCT 41.7 02/19/2023   MCV 91.6 02/19/2023   PLT 213 02/19/2023    Lab Results  Component Value Date   CHOL 178 02/19/2023   HDL 59 02/19/2023   LDLCALC 95 02/19/2023   TRIG 140 02/19/2023   CHOLHDL 3.0 02/19/2023    Lab Results  Component Value Date   HGBA1C 5.8 (H) 02/19/2023     Lab Results  Component Value Date   TSH 2.22 02/19/2023      Assessment & Plan:   Hyperlipidemia: treated with rosuvastatin 10 mg daily. Lipid panel normal.  Hypertension: treated with  amlodipine 5 mg daily, metoprolol tartrate 25 mg daily, hydrochlorothiazide 25 mg daily. Blood pressure normal in-office today at 100/70. She is due for follow-[facility] with [facility].  Mild impaired glucose tolerance: HGBA1c at 5.8% on 02/19/23, down from 5.9% seven months ago. Recommend controlling through diet and exercise. Given information for [facility] and will consider consult.  Moderate obstructive sleep apnea: seen by Rubye Oaks NP and placed on CPAP treatment on 10/25/22.  GE reflux: treated with OTC omeprazole once daily.  Allergies: taking Claritin, Vitamin C/D, zinc.  Urinalysis normal today.  Breast and pelvic exams deferred to GYN physician.  Pap smear last completed 02/17/21. Showed atypical squamous cells of undetermined significance.  Mammogram normal on 02/20/22.  Colonoscopy due again in 2030.  07/21/22 CT coronary calcium scoring showed coronary calcium score of 5.   Vaccine counseling: UTD on flu booster. Administered tetanus booster. Suggested Covid-19, shingles vaccines.  Return in 1 year or as needed.    I,Alexander Ruley,acting as a Neurosurgeon for Margaree Mackintosh, MD.,have documented all relevant documentation on the behalf of Margaree Mackintosh, MD,as directed by  Margaree Mackintosh, MD while in the presence of Margaree Mackintosh, MD.   ***

## 2023-02-20 LAB — COMPLETE METABOLIC PANEL WITH GFR
AG Ratio: 2 (calc) (ref 1.0–2.5)
ALT: 28 U/L (ref 6–29)
AST: 28 U/L (ref 10–35)
Albumin: 4.4 g/dL (ref 3.6–5.1)
Alkaline phosphatase (APISO): 78 U/L (ref 37–153)
BUN: 14 mg/dL (ref 7–25)
CO2: 28 mmol/L (ref 20–32)
Calcium: 9.4 mg/dL (ref 8.6–10.4)
Chloride: 101 mmol/L (ref 98–110)
Creat: 0.89 mg/dL (ref 0.50–1.03)
Globulin: 2.2 g/dL (ref 1.9–3.7)
Glucose, Bld: 99 mg/dL (ref 65–99)
Potassium: 3.8 mmol/L (ref 3.5–5.3)
Sodium: 139 mmol/L (ref 135–146)
Total Bilirubin: 0.6 mg/dL (ref 0.2–1.2)
Total Protein: 6.6 g/dL (ref 6.1–8.1)
eGFR: 76 mL/min/{1.73_m2} (ref >=60)

## 2023-02-20 LAB — CBC WITH DIFFERENTIAL/PLATELET
Absolute Lymphocytes: 1558 {cells}/uL (ref 850–3900)
Absolute Monocytes: 382 {cells}/uL (ref 200–950)
Basophils Absolute: 39 {cells}/uL (ref 0–200)
Basophils Relative: 0.8 %
Eosinophils Absolute: 162 {cells}/uL (ref 15–500)
Eosinophils Relative: 3.3 %
HCT: 41.7 % (ref 35.0–45.0)
Hemoglobin: 13.7 g/dL (ref 11.7–15.5)
MCH: 30.1 pg (ref 27.0–33.0)
MCHC: 32.9 g/dL (ref 32.0–36.0)
MCV: 91.6 fL (ref 80.0–100.0)
MPV: 11 fL (ref 7.5–12.5)
Monocytes Relative: 7.8 %
Neutro Abs: 2759 {cells}/uL (ref 1500–7800)
Neutrophils Relative %: 56.3 %
Platelets: 213 10*3/uL (ref 140–400)
RBC: 4.55 10*6/uL (ref 3.80–5.10)
RDW: 12.6 % (ref 11.0–15.0)
Total Lymphocyte: 31.8 %
WBC: 4.9 10*3/uL (ref 3.8–10.8)

## 2023-02-20 LAB — HEMOGLOBIN A1C
Hgb A1c MFr Bld: 5.8 %{Hb} — ABNORMAL HIGH (ref <5.7)
Mean Plasma Glucose: 120 mg/dL
eAG (mmol/L): 6.6 mmol/L

## 2023-02-20 LAB — LIPID PANEL
Cholesterol: 178 mg/dL (ref <200)
HDL: 59 mg/dL (ref >=50)
LDL Cholesterol (Calc): 95 mg/dL
Non-HDL Cholesterol (Calc): 119 mg/dL (ref <130)
Total CHOL/HDL Ratio: 3 (calc) (ref <5.0)
Triglycerides: 140 mg/dL (ref <150)

## 2023-02-20 LAB — TSH: TSH: 2.22 m[IU]/L (ref 0.40–4.50)

## 2023-02-26 ENCOUNTER — Encounter: Payer: Self-pay | Admitting: Internal Medicine

## 2023-02-26 ENCOUNTER — Ambulatory Visit (INDEPENDENT_AMBULATORY_CARE_PROVIDER_SITE_OTHER): Payer: No Typology Code available for payment source | Admitting: Internal Medicine

## 2023-02-26 VITALS — BP 100/70 | HR 62 | Ht 60.75 in | Wt 201.0 lb

## 2023-02-26 DIAGNOSIS — Z6838 Body mass index (BMI) 38.0-38.9, adult: Secondary | ICD-10-CM | POA: Diagnosis not present

## 2023-02-26 DIAGNOSIS — I10 Essential (primary) hypertension: Secondary | ICD-10-CM

## 2023-02-26 DIAGNOSIS — Z0000 Encounter for general adult medical examination without abnormal findings: Secondary | ICD-10-CM

## 2023-02-26 DIAGNOSIS — E782 Mixed hyperlipidemia: Secondary | ICD-10-CM

## 2023-02-26 DIAGNOSIS — Z23 Encounter for immunization: Secondary | ICD-10-CM

## 2023-02-26 DIAGNOSIS — E7439 Other disorders of intestinal carbohydrate absorption: Secondary | ICD-10-CM

## 2023-02-26 DIAGNOSIS — M5090 Cervical disc disorder, unspecified, unspecified cervical region: Secondary | ICD-10-CM

## 2023-02-26 DIAGNOSIS — Z8719 Personal history of other diseases of the digestive system: Secondary | ICD-10-CM

## 2023-02-26 DIAGNOSIS — G4733 Obstructive sleep apnea (adult) (pediatric): Secondary | ICD-10-CM

## 2023-02-26 DIAGNOSIS — Z8249 Family history of ischemic heart disease and other diseases of the circulatory system: Secondary | ICD-10-CM

## 2023-02-26 LAB — POCT URINALYSIS DIP (CLINITEK)
Bilirubin, UA: NEGATIVE
Blood, UA: NEGATIVE
Glucose, UA: NEGATIVE mg/dL
Ketones, POC UA: NEGATIVE mg/dL
Leukocytes, UA: NEGATIVE
Nitrite, UA: NEGATIVE
POC PROTEIN,UA: NEGATIVE
Spec Grav, UA: 1.01 (ref 1.010–1.025)
Urobilinogen, UA: 0.2 U/dL
pH, UA: 7.5 (ref 5.0–8.0)

## 2023-02-26 LAB — HM MAMMOGRAPHY

## 2023-02-26 NOTE — Patient Instructions (Addendum)
Reminded about colonoscopy due January 2025. Tdap given. Reminded about Shingrix vaccine. Information given on weight loss clinics. Vaccines discussed. Labs are stable. Return in one year or as needed.

## 2023-02-27 ENCOUNTER — Encounter: Payer: Self-pay | Admitting: Internal Medicine

## 2023-03-03 ENCOUNTER — Other Ambulatory Visit: Payer: Self-pay | Admitting: Internal Medicine

## 2023-03-19 ENCOUNTER — Other Ambulatory Visit: Payer: Self-pay | Admitting: Cardiology

## 2023-04-06 ENCOUNTER — Telehealth (INDEPENDENT_AMBULATORY_CARE_PROVIDER_SITE_OTHER): Payer: No Typology Code available for payment source | Admitting: Internal Medicine

## 2023-04-06 DIAGNOSIS — J0100 Acute maxillary sinusitis, unspecified: Secondary | ICD-10-CM

## 2023-04-06 MED ORDER — AZITHROMYCIN 250 MG PO TABS: ORAL_TABLET | ORAL | 0 refills | Status: AC

## 2023-04-06 MED ORDER — FLUCONAZOLE 150 MG PO TABS: 150.0000 mg | ORAL_TABLET | Freq: Once | ORAL | 0 refills | Status: AC

## 2023-04-06 NOTE — Progress Notes (Signed)
Patient Care Team: Margaree Mackintosh, MD as PCP - General (Internal Medicine) Parke Poisson, MD as PCP - Cardiology (Cardiology)  I connected with [name] on 04/06/23 at 3:02 PM by video enabled telemedicine visit and verified that I am speaking with the correct person using two identifiers.   I discussed the limitations, risks, security and privacy concerns of performing an evaluation and management service by telemedicine and the availability of in-person appointments. I also discussed with the patient that there may be a patient responsible charge related to this service. The patient expressed understanding and agreed to proceed.   Other persons participating in the visit and their role in the encounter: Medical scribe, Doylene Bode  Patient's location: Home  Provider's location: Clinic   I provided 15 minutes of face-to-face video visit time during this encounter, and > 50% was spent counseling as documented under my assessment & plan. She is identified using two identifiers, [name], a patient of this practice She is in her house and I am in my practice. She is agreeable to using this format today.  Chief Complaint: Congestion  Subjective:    Patient ID: [name] , Female    DOB: February 09, 1966, 57 y.o.    MRN: [id number]   57 y.o. Female presents today for sick visit. Patient reports that she began feeling congested Monday 12/09, which has worsened over the course of the week with onset of other symptoms such as nasal sinus tenderness causing dental and mandibular tenderness, post-nasal pain, ear pain and congestion, and mild frontal headache. Endorses yellow mucus when blowing her nose. Denies chest congestion, sore throat, fever, post-nasal drip, or cough She has tried decongestants, a saline nasal spray, and today took Aleve for her headache. She has been trying to rest as much as possible. She is UTD on her flu vaccination, not her Covid, and she did not  test herself for Covid at-home.   Past Medical History:  Diagnosis Date   Migraine     Family History  Problem Relation Age of Onset   Heart disease Mother    Cancer Father     Social History: Married with 4 adult children. Nonsmoker.     Review of Systems  Constitutional:  Negative for fever and malaise/fatigue.  HENT:  Positive for congestion (nasal and ear), ear pain and sinus pain (nasal).        (+) Post-nasal pain (+) Dental & Mandibular tenderness  Eyes:  Negative for blurred vision.  Respiratory:  Negative for cough and shortness of breath.   Cardiovascular:  Negative for chest pain, palpitations and leg swelling.  Gastrointestinal:  Negative for vomiting.  Musculoskeletal:  Negative for back pain.  Skin:  Negative for rash.  Neurological:  Positive for headaches (mild frontal headache). Negative for loss of consciousness.        Objective:   Vitals: There were no vitals taken for this visit.   Physical Exam Vitals and nursing note reviewed.  Constitutional:      General: She is not in acute distress.    Appearance: Normal appearance. She is not toxic-appearing.  HENT:     Head: Normocephalic and atraumatic.  Pulmonary:     Effort: Pulmonary effort is normal.  Skin:    General: Skin is warm and dry.  Neurological:     Mental Status: She is alert and oriented to person, place, and time. Mental status is at baseline.  Psychiatric:  Mood and Affect: Mood normal.        Behavior: Behavior normal.        Thought Content: Thought content normal.        Judgment: Judgment normal.       Results:   Studies obtained and personally reviewed by me:   Labs:       Component Value Date/Time   NA 139 02/19/2023 0915   K 3.8 02/19/2023 0915   CL 101 02/19/2023 0915   CO2 28 02/19/2023 0915   GLUCOSE 99 02/19/2023 0915   BUN 14 02/19/2023 0915   CREATININE 0.89 02/19/2023 0915   CALCIUM 9.4 02/19/2023 0915   PROT 6.6 02/19/2023 0915   ALBUMIN  3.8 07/21/2022 0408   AST 28 02/19/2023 0915   ALT 28 02/19/2023 0915   ALKPHOS 65 07/21/2022 0408   BILITOT 0.6 02/19/2023 0915   GFRNONAA >60 07/21/2022 0408   GFRNONAA 74 02/09/2020 0913   GFRAA 86 02/09/2020 0913     Lab Results  Component Value Date   WBC 4.9 02/19/2023   HGB 13.7 02/19/2023   HCT 41.7 02/19/2023   MCV 91.6 02/19/2023   PLT 213 02/19/2023    Lab Results  Component Value Date   CHOL 178 02/19/2023   HDL 59 02/19/2023   LDLCALC 95 02/19/2023   TRIG 140 02/19/2023   CHOLHDL 3.0 02/19/2023    Lab Results  Component Value Date   HGBA1C 5.8 (H) 02/19/2023     Lab Results  Component Value Date   TSH 2.22 02/19/2023       Assessment & Plan:   Acute upper respiratory infection: Prescribed Azithromycin 6 tablets, take 2 tablets on day 1 and 1 tablet day 2-5.Also sending in 150 mg Diflucan in case you develop a yeast infection. Stay well hydrated and rest as much as possible. Let us know by Tuesday/Wednesday if your symptoms have worsened.     I,Alexander Ruley,acting as a Neurosurgeon for Margaree Mackintosh, MD.,have documented all relevant documentation on the behalf of Margaree Mackintosh, MD   I, Margaree Mackintosh, MD, have reviewed all documentation for this visit. The documentation on 04/09/23 for the exam, diagnosis, procedures, and orders are all accurate and complete.

## 2023-04-09 NOTE — Patient Instructions (Signed)
Please take Zithromax Z-PAK 2 tabs day 1 followed by 1 tab days 2 through 5.  Also sending in Diflucan 150 mg tablet in case you develop a yeast infection while on antibiotics.  Rest and stay well-hydrated.  Call us early next week if you are not improving.

## 2023-07-09 LAB — HM COLONOSCOPY

## 2023-07-30 ENCOUNTER — Encounter: Payer: Self-pay | Admitting: Adult Health

## 2023-07-30 ENCOUNTER — Ambulatory Visit (INDEPENDENT_AMBULATORY_CARE_PROVIDER_SITE_OTHER): Payer: No Typology Code available for payment source | Admitting: Adult Health

## 2023-07-30 VITALS — BP 120/86 | HR 66 | Ht 62.5 in | Wt 201.0 lb

## 2023-07-30 DIAGNOSIS — G4733 Obstructive sleep apnea (adult) (pediatric): Secondary | ICD-10-CM | POA: Diagnosis not present

## 2023-07-30 NOTE — Patient Instructions (Addendum)
 Continue on CPAP At bedtime, wear all night long for at least 6hr or more  Work on healthy sleep regimen  Work on healthy weight.  Do not drive sleepy  Follow up in 1 year and As needed

## 2023-07-30 NOTE — Assessment & Plan Note (Signed)
 Healthy weight loss discussed

## 2023-07-30 NOTE — Assessment & Plan Note (Signed)
 Excellent control compliance on nocturnal CPAP.  Continue on current settings  Plan  Patient Instructions  Continue on CPAP At bedtime, wear all night long for at least 6hr or more  Work on healthy sleep regimen  Work on healthy weight.  Do not drive sleepy  Follow up in 1 year and As needed

## 2023-07-30 NOTE — Progress Notes (Signed)
 @Patient  ID: [name], female    DOB: Mar 06, 1966, 58 y.o.   MRN: [id number]  Chief Complaint  Patient presents with   Follow-up    Referring provider: Margaree Mackintosh, MD  HPI: 58 yo female followed for moderate OSA  Medical history significant for hypertension and hyperlipidemia  TEST/EVENTS :  Home sleep study September 29, 2022 showed moderate sleep apnea with an AHI at 26/hour and SpO2 low at 74%   07/30/2023 Follow up : OSA  Patient returns for a 40-month follow up.  Patient has moderate obstructive sleep apnea.  She is on CPAP at bedtime.  Patient says she is doing better on CPAP.  She does feel that she is benefiting from CPAP with decreased daytime sleepiness.  She is currently using a DreamWear nasal mask.  She says occasionally she falls asleep before she puts on her CPAP then wakes up and puts it on.  She definitely feels better when she wears it longer at nighttime.  CPAP download shows 100% compliance.  Daily average usage at 5 hours.  Patient is on auto CPAP 5 to 12 cm H2O.  Daily average pressure 11.4 cm H2O.  AHI 1.3/hour.  Allergies  Allergen Reactions   Cephalexin Nausea And Vomiting   Pantoprazole Other (See Comments)    Headache    Immunization History  Administered Date(s) Administered   Influenza, Seasonal, Injecte, Preservative Fre 01/29/2023   Influenza,inj,Quad PF,6+ Mos 02/12/2020, 02/17/2021, 02/22/2022   Influenza-Unspecified 02/06/2019   Tdap 05/09/2012, 02/26/2023    Past Medical History:  Diagnosis Date   Migraine     Tobacco History: Social History   Tobacco Use  Smoking Status Never  Smokeless Tobacco Never   Counseling given: Not Answered   Outpatient Medications Prior to Visit  Medication Sig Dispense Refill   acetaminophen (TYLENOL) 500 MG tablet Take 1,000 mg by mouth every 6 (six) hours as needed for moderate pain.     amLODipine (NORVASC) 5 MG tablet TAKE 1 TABLET(5 MG) BY MOUTH EVERY MORNING 90 tablet 1   aspirin 81 MG  tablet Take 81 mg by mouth as needed.      b complex vitamins capsule Take 1 capsule by mouth as needed.     cholecalciferol (VITAMIN D) 1000 units tablet Take 1,000 Units by mouth daily.     hydrochlorothiazide (HYDRODIURIL) 25 MG tablet TAKE 1 TABLET(25 MG) BY MOUTH DAILY 90 tablet 1   ibuprofen (ADVIL,MOTRIN) 200 MG tablet Take 200 mg by mouth every 6 (six) hours as needed for moderate pain.      loratadine (CLARITIN) 10 MG tablet Take 10 mg by mouth daily.     metoprolol tartrate (LOPRESSOR) 25 MG tablet TAKE 1 TABLET(25 MG) BY MOUTH DAILY 90 tablet 1   naproxen sodium (ANAPROX) 220 MG tablet Take 440 mg by mouth 2 (two) times daily as needed (pain).     OVER THE COUNTER MEDICATION Take 1 capsule by mouth daily. Quercitine (for immunity)     Probiotic Product (PROBIOTIC-10 PO) Take by mouth.     Quercetin 500 MG CAPS Take 500 mg by mouth daily.     rosuvastatin (CRESTOR) 10 MG tablet TAKE 1 TABLET(10 MG) BY MOUTH DAILY 90 tablet 1   Vitamin C-Vitamin D-Zinc (D3/VITAMIN C/ZINC PO) Take by mouth daily.     triamcinolone cream (KENALOG) 0.1 % Apply 1 application. topically 2 (two) times daily. (Patient not taking: Reported on 07/30/2023) 30 g 0   No facility-administered medications prior to visit.  Review of Systems:   Constitutional:   No  weight loss, night sweats,  Fevers, chills, fatigue, or  lassitude.  HEENT:   No headaches,  Difficulty swallowing,  Tooth/dental problems, or  Sore throat,                No sneezing, itching, ear ache, nasal congestion, post nasal drip,   CV:  No chest pain,  Orthopnea, PND, swelling in lower extremities, anasarca, dizziness, palpitations, syncope.   GI  No heartburn, indigestion, abdominal pain, nausea, vomiting, diarrhea, change in bowel habits, loss of appetite, bloody stools.   Resp: No shortness of breath with exertion or at rest.  No excess mucus, no productive cough,  No non-productive cough,  No coughing up of blood.  No change in color  of mucus.  No wheezing.  No chest wall deformity  Skin: no rash or lesions.  GU: no dysuria, change in color of urine, no urgency or frequency.  No flank pain, no hematuria   MS:  No joint pain or swelling.  No decreased range of motion.  No back pain.    Physical Exam  BP 120/86 (BP Location: Left Arm, Patient Position: Sitting, Cuff Size: Large)   Pulse 66   Ht 5' 2.5" (1.588 m)   Wt 201 lb (91.2 kg)   SpO2 99%   BMI 36.18 kg/m   GEN: A/Ox3; pleasant , NAD, well nourished    HEENT:  [place]/AT,   NOSE-clear, THROAT-clear, no lesions, no postnasal drip or exudate noted.   NECK:  Supple w/ fair ROM; no JVD; normal carotid impulses w/o bruits; no thyromegaly or nodules palpated; no lymphadenopathy.    RESP  Clear  P & A; w/o, wheezes/ rales/ or rhonchi. no accessory muscle use, no dullness to percussion  CARD:  RRR, no m/r/g, no peripheral edema, pulses intact, no cyanosis or clubbing.  GI:   Soft & nt; nml bowel sounds; no organomegaly or masses detected.   Musco: Warm bil, no deformities or joint swelling noted.   Neuro: alert, no focal deficits noted.    Skin: Warm, no lesions or rashes    Lab Results:  CBC    Component Value Date/Time   WBC 4.9 02/19/2023 0915   RBC 4.55 02/19/2023 0915   HGB 13.7 02/19/2023 0915   HCT 41.7 02/19/2023 0915   PLT 213 02/19/2023 0915   MCV 91.6 02/19/2023 0915   MCH 30.1 02/19/2023 0915   MCHC 32.9 02/19/2023 0915   RDW 12.6 02/19/2023 0915   LYMPHSABS 1,574 02/16/2022 1209   MONOABS 0.5 04/08/2015 0433   EOSABS 162 02/19/2023 0915   BASOSABS 39 02/19/2023 0915    BMET    Component Value Date/Time   NA 139 02/19/2023 0915   K 3.8 02/19/2023 0915   CL 101 02/19/2023 0915   CO2 28 02/19/2023 0915   GLUCOSE 99 02/19/2023 0915   BUN 14 02/19/2023 0915   CREATININE 0.89 02/19/2023 0915   CALCIUM 9.4 02/19/2023 0915   GFRNONAA >60 07/21/2022 0408   GFRNONAA 74 02/09/2020 0913   GFRAA 86 02/09/2020 0913    BNP No  results found for: "BNP"  ProBNP No results found for: "PROBNP"  Imaging: No results found.  Administration History     None           No data to display          No results found for: "NITRICOXIDE"      Assessment & Plan:  OSA (obstructive sleep apnea) Excellent control compliance on nocturnal CPAP.  Continue on current settings  Plan  Patient Instructions  Continue on CPAP At bedtime, wear all night long for at least 6hr or more  Work on healthy sleep regimen  Work on healthy weight.  Do not drive sleepy  Follow up in 1 year and As needed      Obesity Healthy weight loss discussed     Rubye Oaks, NP 07/30/2023

## 2023-08-16 ENCOUNTER — Ambulatory Visit: Payer: Self-pay

## 2023-08-16 NOTE — Telephone Encounter (Signed)
  Chief Complaint: Congestion Symptoms: congestion, sinus pressure Frequency: Constant X 1.5 weeks Pertinent Negatives: Patient denies fever, difficulty breathing Disposition: [] ED /[x] Urgent Care (no appt availability in office) / [] Appointment(In office/virtual)/ []  [facility]/ [] Home Care/ [] Refused Recommended Disposition /[] [place] [facility]/ []  Follow-up with PCP Additional Notes:  1.5 weeks ago symptoms started as "allergies'. About 5 days ago her symptoms progressed to chest and sinus congestion. Today  her ears feels full, head feels heavy, sinus pressure. Multiple colored sputum. All over achy, joint pain. Fatigue. Acute evaluation advised, no acute visits available in practice until Aug 23, 2023, patient states she was aware of that and pending a call back from clinic to see if she could be scheduled, has been waiting for "hours" and if I can't get in today I'm going to urgent care. Upset she was waiting to hear back about an appointment and now needing to go to urgent care. Educated on care advice as documented in protocol, patient verbalized understanding.   Copied from CRM [id number]. Topic: Clinical - Red Word Triage >> Aug 16, 2023 11:09 AM Juluis Ok wrote: [facility] that prompted transfer to Nurse Triage:  severe Body aches, chest and head congestion x 5 days Reason for Disposition  Earache  Protocols used: Sinus Pain or Congestion-A-AH

## 2023-09-02 ENCOUNTER — Other Ambulatory Visit: Payer: Self-pay | Admitting: Internal Medicine

## 2023-09-15 ENCOUNTER — Other Ambulatory Visit: Payer: Self-pay | Admitting: Internal Medicine

## 2023-09-20 ENCOUNTER — Other Ambulatory Visit: Payer: Self-pay | Admitting: Internal Medicine

## 2024-01-01 ENCOUNTER — Ambulatory Visit (INDEPENDENT_AMBULATORY_CARE_PROVIDER_SITE_OTHER): Admitting: Internal Medicine

## 2024-01-01 ENCOUNTER — Ambulatory Visit: Payer: Self-pay

## 2024-01-01 ENCOUNTER — Encounter: Payer: Self-pay | Admitting: Internal Medicine

## 2024-01-01 VITALS — BP 110/80 | HR 63 | Temp 98.6°F | Ht 65.25 in

## 2024-01-01 DIAGNOSIS — R52 Pain, unspecified: Secondary | ICD-10-CM

## 2024-01-01 DIAGNOSIS — J22 Unspecified acute lower respiratory infection: Secondary | ICD-10-CM | POA: Diagnosis not present

## 2024-01-01 DIAGNOSIS — R059 Cough, unspecified: Secondary | ICD-10-CM

## 2024-01-01 LAB — POC COVID19/FLU A&B COMBO
Covid Antigen, POC: NEGATIVE
Influenza A Antigen, POC: NEGATIVE
Influenza B Antigen, POC: NEGATIVE

## 2024-01-01 MED ORDER — AZITHROMYCIN 250 MG PO TABS: ORAL_TABLET | ORAL | 0 refills | Status: AC

## 2024-01-01 NOTE — Patient Instructions (Addendum)
 You have been diagnosed with an acute lower respiratory infection.  Please take Zithromax  2 tabs day 1 followed by 1 tab days 2 through 5. Rest and stay well hydrated. Covid test is negative. Call if not better in 5-7 days or sooner if worse.

## 2024-01-01 NOTE — Telephone Encounter (Signed)
 FYI Only or Action Required?: FYI only for provider.  Patient was last seen in primary care on 04/06/2023 by Perri Ronal PARAS, MD.  Called Nurse Triage reporting Cough.  Symptoms began Saturday.  Interventions attempted: Rest, hydration, or home remedies.  Symptoms are: gradually worsening.  Triage Disposition: See HCP Within 4 Hours (Or PCP Triage)  Patient/caregiver understands and will follow disposition?: Yes       Copied from CRM #[id number]. Topic: Clinical - Red Word Triage >> Jan 01, 2024  8:09 AM Deleta RAMAN wrote: Red Word that prompted transfer to Nurse Triage: patient has body aches, congestion that has transferred to chest, dry cough. Reason for Disposition  Wheezing is present  Answer Assessment - Initial Assessment Questions 1. ONSET: When did the cough begin?      Saturday, worsening today 2. SEVERITY: How bad is the cough today?      Worse today 3. SPUTUM: Describe the color of your sputum (e.g., none, dry cough; clear, white, yellow, green)     Brown 4. HEMOPTYSIS: Are you coughing up any blood? If Yes, ask: How much? (e.g., flecks, streaks, tablespoons, etc.)     A small amount x 1 episode 5. DIFFICULTY BREATHING: Are you having difficulty breathing? If Yes, ask: How bad is it? (e.g., mild, moderate, severe)      Not at this time 6. FEVER: Do you have a fever? If Yes, ask: What is your temperature, how was it measured, and when did it start?     Hasn't taken temperature, but does have body aches, weakness 10. OTHER SYMPTOMS: Do you have any other symptoms? (e.g., runny nose, wheezing, chest pain)       Chest tightness, runny nose, wheezing  Protocols used: Cough - Acute Productive-A-AH

## 2024-01-01 NOTE — Progress Notes (Signed)
 Patient Care Team: Perri Ronal PARAS, MD as PCP - General (Internal Medicine) Loni Soyla LABOR, MD as PCP - Cardiology (Cardiology)  Visit Date: 01/01/24  Subjective:    Patient ID: [name] , Female   DOB: 24-Jul-1965, 58 y.o.    MRN: [id number]   58 y.o. Female presents today for office visit for cough and chest tightness. Patient has a past medical history of Essential Hypertension unstable angina, Hyperlipidemia, GERD, Obstructive sleep apnea.    She said it began as a tickle in her throat and then transitioned into chest and sinus congestion and a cough. Denies fever and chills but says her body aches. She has redness in her eyes and coryza. Covid and influenza tests are negative. Azithromycin  250 mg 2 tablets on the first day and 1 on days 2-5 prescribed  Vaccine counseling: Influenza and PNA vaccines recommended     History of Hyperlipidemia treated with rosuvastatin  10 mg daily    History of Hypertension treated with amlodipine  5 mg daily, Metoprolol  tartrate 25 mg daily, Hydrochlorothiazide  25 mg daily blood pressure today is 110/80  History of Moderate obstuctive sleep apnea seen by Madelin Stank NP and placed on a CPAP treatment on 10/25/22    History of GE reflux treated with OTC Omeprozole once daily   Admitted to hospital for unstable angina from 07/20/22 - 07/21/22. EKG without ischemic EKG changes including T wave inversions or ST segment changes. Chest X-ray unremarkable. 07/21/22 coronary CT showed: 1) Coronary calcium  score of 5. 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 BMP and CBC unremarkable.    Past Medical History:  Diagnosis Date   Migraine      Family History  Problem Relation Age of Onset   Heart disease Mother    Cancer Father          Review of Systems   Constitutional:        Body aches   HENT:  Positive for congestion.   Eyes:  Positive for redness.  Respiratory:  Positive for cough and sputum production.         Objective:   Vitals: BP 110/80   Pulse 63   Temp 98.6 F (37 C)   Ht 5' 5.25 (1.657 m)   SpO2 98%   BMI 33.19 kg/m    Physical Exam HENT:     Nose:     Comments: Coryza      Results:     Labs:       Component Value Date/Time   NA 139 02/19/2023 0915   K 3.8 02/19/2023 0915   CL 101 02/19/2023 0915   CO2 28 02/19/2023 0915   GLUCOSE 99 02/19/2023 0915   BUN 14 02/19/2023 0915   CREATININE 0.89 02/19/2023 0915   CALCIUM  9.4 02/19/2023 0915   PROT 6.6 02/19/2023 0915   ALBUMIN 3.8 07/21/2022 0408   AST 28 02/19/2023 0915   ALT 28 02/19/2023 0915   ALKPHOS 65 07/21/2022 0408   BILITOT 0.6 02/19/2023 0915   GFRNONAA >60 07/21/2022 0408   GFRNONAA 74 02/09/2020 0913   GFRAA 86 02/09/2020 0913     Lab Results  Component Value Date   WBC 4.9 02/19/2023   HGB 13.7 02/19/2023   HCT 41.7 02/19/2023   MCV 91.6 02/19/2023   PLT 213 02/19/2023  Lab Results  Component Value Date   CHOL 178 02/19/2023   HDL 59 02/19/2023   LDLCALC 95 02/19/2023   TRIG 140 02/19/2023   CHOLHDL 3.0 02/19/2023    Lab Results  Component Value Date   HGBA1C 5.8 (H) 02/19/2023     Lab Results  Component Value Date   TSH 2.22 02/19/2023         Assessment & Plan:    Acute lower respiratory infection: She said it began as a tickle in her throat and then transitioned into chest and sinus congestion and a cough. Denies fever and chills but says her body aches. She has redness in her eyes and coryza. Covid and influenza tests are negative.   Azithromycin  250 mg 2 tablets on the first day and 1 on days 2-5 prescribed  Vaccine counseling: Influenza and PNA vaccines recommended    I,Makayla C Reid,acting as a scribe for Ronal JINNY Hailstone, MD.,have documented all relevant documentation on the behalf of Ronal JINNY Hailstone, MD,as directed by  Ronal JINNY Hailstone, MD while in the presence of Ronal JINNY Hailstone, MD.

## 2024-01-04 ENCOUNTER — Ambulatory Visit (INDEPENDENT_AMBULATORY_CARE_PROVIDER_SITE_OTHER): Admitting: Internal Medicine

## 2024-01-04 ENCOUNTER — Ambulatory Visit: Payer: Self-pay | Admitting: *Deleted

## 2024-01-04 VITALS — BP 120/86 | HR 63 | Temp 98.5°F | Ht 63.0 in | Wt 201.8 lb

## 2024-01-04 DIAGNOSIS — I889 Nonspecific lymphadenitis, unspecified: Secondary | ICD-10-CM

## 2024-01-04 MED ORDER — LEVOFLOXACIN 500 MG PO TABS: 500.0000 mg | ORAL_TABLET | Freq: Every day | ORAL | 0 refills | Status: AC

## 2024-01-04 NOTE — Telephone Encounter (Signed)
 Copied from CRM [id number]. Topic: Clinical - Red Word Triage >> Jan 04, 2024  8:33 AM Mia F wrote: Red Word that prompted transfer to Nurse Triage: Pt was given an antibiotic earlier this week for a lower respitory infection. She noticed a swollen lymph node on the left side of her face on the lower part of her ear, along her jaw line late last night. It is very sore and swollen. She says it is very noticeable. Reason for Disposition  [1] Swelling is painful to touch AND [2] fever  Answer Assessment - Initial Assessment Questions 1. APPEARANCE of SWELLING: What does it look like?     Pt seen on 01/01/2024 and started on antibiotics.   I'm having lower respiratory issues.    I'm coughing up brown stuff. I'm having soreness under my left earlobe.  It's very swollen on left side.     This just started last night I noticed the soreness.    It's gotten bigger overnight.   It's sore.  2. SIZE: How large is the swelling? (e.g., inches, cm; or compare to size of pinhead, tip of pen, eraser, coin, pea, grape, ping pong ball)      It's larger than a pea.    It's big and swollen. 3. LOCATION: Where is the swelling located?     Left jaw under my earlobe 4. ONSET: When did the swelling start?     Last night 5. COLOR: What color is it? Is there more than one color?     Flesh colorer 6. PAIN: Is there any pain? If Yes, ask: How bad is the pain? (Scale 1-10; or mild, moderate, severe)       Yes 7. ITCH: Does it itch? If Yes, ask: How bad is the itch?      Not asked 8. CAUSE: What do you think caused the swelling?     From the URI symptoms I'm having, I think 9 OTHER SYMPTOMS: Do you have any other symptoms? (e.g., fever)     Still coughing, my nasal congestion is better.  I've pulled a muscle in my left side from coughing.    I can't drive I'm so sore.  Protocols used: Skin Lump or Localized Swelling-A-AH FYI Only or Action Required?: FYI only for provider.  Patient was last seen  in primary care on 01/01/2024 by Perri Ronal PARAS, MD. For URI   On antibiotic  Called Nurse Triage reporting Cyst.Left lymph node is very swollen and sore.    Symptoms began a week ago.Lymph node started last night and is worse this morning.  Interventions attempted: Nothing.  Symptoms are: rapidly worsening.  Triage Disposition: See HCP Within 4 Hours (Or PCP Triage)Video visit set up for today with Dr. Perri   Patient/caregiver understands and will follow disposition?: Yes

## 2024-01-04 NOTE — Progress Notes (Shared)
 Patient Care Team: Perri Casey PARAS, MD as PCP - General (Internal Medicine) Loni Soyla LABOR, MD as PCP - Cardiology (Cardiology)  Visit Date: 01/04/24  Subjective:   Chief Complaint  Patient presents with   Medical Management of Chronic Issues    Noticed last night. States it has gone down a little bit, and states it was sore.    Patient PI:[name],Female DOB:11/18/1965,58 y.o. FMW:[id number]   58 y.o.Female presents today for acute visit swollen left lymph node . Patient has a past medical history of Essential Hypertension, Unstable angina, Hyperlipidemia, GE Reflux, Obstructive sleep apnea.   She was last seen 01/01/24 for an acute respiratory infection, for which she was prescribed Azithromycin  250 mg x5 days. Today, she says that while she is still experiencing congestion still, what she is more concerned with is that, despite taking antibiotics, she noticed swelling on the left side of her neck. She denies dental pain when brushing teeth.  Additionally, she mentions that she has had pain in her left side, aggravated by coughing. Denies N/V/D. Past Medical History:  Diagnosis Date   Migraine     Allergies  Allergen Reactions   Cephalexin Nausea And Vomiting   Pantoprazole  Other (See Comments)    Headache   Immunization History  Administered Date(s) Administered   Influenza, Seasonal, Injecte, Preservative Fre 01/29/2023   Influenza,inj,Quad PF,6+ Mos 02/12/2020, 02/17/2021, 02/22/2022   Influenza-Unspecified 02/06/2019   Tdap 05/09/2012, 02/26/2023   Past Surgical History:  Procedure Laterality Date   FOOT SURGERY Right 04/19/2021   LIPOMA EXCISION      Family History  Problem Relation Age of Onset   Heart disease Mother    Cancer Father    Social History   Social History Narrative   Not on file   Review of Systems  Constitutional:  Negative for chills and fever.  HENT:  Negative for congestion.        (+) Swollen Lymph-node, left-side   Respiratory:  Negative for cough.   Gastrointestinal:  Negative for diarrhea, nausea and vomiting.  Musculoskeletal:        (+) Left-sided trunk pain     Objective:  Vitals: BP 120/86   Pulse 63   Temp 98.5 F (36.9 C) (Oral)   Ht 5' 3 (1.6 m)   Wt 201 lb 12.8 oz (91.5 kg)   SpO2 96%   BMI 35.75 kg/m   Physical Exam Vitals and nursing note reviewed.  Constitutional:      General: She is not in acute distress.    Appearance: Normal appearance. She is not ill-appearing or toxic-appearing.  HENT:     Head: Normocephalic and atraumatic.     Right Ear: Ear canal and external ear normal. Tympanic membrane is injected. Tympanic membrane is not erythematous.     Left Ear: Tympanic membrane, ear canal and external ear normal.     Mouth/Throat:     Mouth: Mucous membranes are moist.     Pharynx: Oropharynx is clear. No oropharyngeal exudate or posterior oropharyngeal erythema.  Neck:     Comments: Tenderness to left-side of face & neck on palpation Pulmonary:     Effort: Pulmonary effort is normal.     Breath sounds: Normal breath sounds. No wheezing, rhonchi or rales.  Abdominal:     Palpations: Abdomen is soft. There is no hepatomegaly or splenomegaly.     Tenderness: There is abdominal tenderness (left-sided, likely muscular in nature).  Lymphadenopathy:     Cervical: Cervical adenopathy (  left-sided) present.  Skin:    General: Skin is warm and dry.  Neurological:     Mental Status: She is alert and oriented to person, place, and time. Mental status is at baseline.  Psychiatric:        Mood and Affect: Mood normal.        Behavior: Behavior normal.        Thought Content: Thought content normal.        Judgment: Judgment normal.     Results:  Studies Obtained And Personally Reviewed By Me: Labs:  CBC w/ Differential Lab Results  Component Value Date   WBC 4.9 02/19/2023   RBC 4.55 02/19/2023   HGB 13.7 02/19/2023   HCT 41.7 02/19/2023   PLT 213 02/19/2023   MCV  91.6 02/19/2023   MCH 30.1 02/19/2023   MCHC 32.9 02/19/2023   RDW 12.6 02/19/2023   MPV 11.0 02/19/2023   LYMPHSABS 1,574 02/16/2022   MONOABS 0.5 04/08/2015   BASOSABS 39 02/19/2023    Comprehensive Metabolic Panel Lab Results  Component Value Date   NA 139 02/19/2023   K 3.8 02/19/2023   CL 101 02/19/2023   CO2 28 02/19/2023   GLUCOSE 99 02/19/2023   BUN 14 02/19/2023   CREATININE 0.89 02/19/2023   CALCIUM  9.4 02/19/2023   PROT 6.6 02/19/2023   ALBUMIN 3.8 07/21/2022   AST 28 02/19/2023   ALT 28 02/19/2023   ALKPHOS 65 07/21/2022   BILITOT 0.6 02/19/2023   EGFR 76 02/19/2023   GFRNONAA >60 07/21/2022   Lipid Panel  Lab Results  Component Value Date   CHOL 178 02/19/2023   HDL 59 02/19/2023   LDLCALC 95 02/19/2023   TRIG 140 02/19/2023   A1c Lab Results  Component Value Date   HGBA1C 5.8 (H) 02/19/2023    TSH Lab Results  Component Value Date   TSH 2.22 02/19/2023   Assessment & Plan:   Lymphadenitis: seen 01/01/24 for an acute respiratory infection, for which she was prescribed Azithromycin  250 mg x5 days. Today, she says that while she is still experiencing congestion still, what she is more concerned with is that, despite taking antibiotics, she noticed swelling on the left side of her neck. She denies dental pain when brushing teeth. On exam there is tenderness to left-side of her face & neck on palpation correlating with left-sided lymphadenopathy.   Since it has only been 3 days since she was initially seen, I would expect her symptoms to have improved some, but I am not concerned with the development of lymphadenopathy, but will go ahead and send in Levaquin  500 mg - take 1 tablet daily with a meal for 7 days.   Additionally, she mentions that she has had pain in her left side, aggravated by coughing. Denies N/V/D. On exam, there is tenderness to the right-side of her truck when palpated, but no hepato-splenomegaly.   Likely muscular in nature due to her  coughing, so I have advised that she use a heating pad and Aleve/Advil/Tylenol  for relief.    I,Emily Lagle,acting as a Neurosurgeon for Casey JINNY Hailstone, MD.,have documented all relevant documentation on the behalf of Casey JINNY Hailstone, MD,as directed by  Casey JINNY Hailstone, MD while in the presence of Casey JINNY Hailstone, MD.  I, Casey JINNY Hailstone, MD, have reviewed all documentation for this visit. The documentation on 01/04/2024 for the exam, diagnosis, procedures, and orders are all accurate and complete.

## 2024-01-06 ENCOUNTER — Encounter: Payer: Self-pay | Admitting: Internal Medicine

## 2024-01-06 NOTE — Patient Instructions (Addendum)
 Take Levaquin  500 mg daily  x 7 days for protracted respiratory symptoms/ acute lower respiratory infection. Rest and stay well hydrated. May take Tylenol  or Aleve for musculoskeletal pain.

## 2024-02-21 ENCOUNTER — Other Ambulatory Visit: Payer: No Typology Code available for payment source

## 2024-02-21 DIAGNOSIS — I10 Essential (primary) hypertension: Secondary | ICD-10-CM

## 2024-02-21 DIAGNOSIS — E782 Mixed hyperlipidemia: Secondary | ICD-10-CM

## 2024-02-21 DIAGNOSIS — E7439 Other disorders of intestinal carbohydrate absorption: Secondary | ICD-10-CM

## 2024-02-21 DIAGNOSIS — Z0000 Encounter for general adult medical examination without abnormal findings: Secondary | ICD-10-CM

## 2024-02-21 DIAGNOSIS — Z1329 Encounter for screening for other suspected endocrine disorder: Secondary | ICD-10-CM

## 2024-02-22 ENCOUNTER — Ambulatory Visit: Payer: Self-pay | Admitting: Internal Medicine

## 2024-02-22 LAB — LIPID PANEL
Cholesterol: 149 mg/dL (ref <200)
HDL: 67 mg/dL (ref >=50)
LDL Cholesterol (Calc): 65 mg/dL
Non-HDL Cholesterol (Calc): 82 mg/dL (ref <130)
Total CHOL/HDL Ratio: 2.2 (calc) (ref <5.0)
Triglycerides: 86 mg/dL (ref <150)

## 2024-02-22 LAB — CBC WITH DIFFERENTIAL/PLATELET
Absolute Lymphocytes: 1531 {cells}/uL (ref 850–3900)
Absolute Monocytes: 374 {cells}/uL (ref 200–950)
Basophils Absolute: 38 {cells}/uL (ref 0–200)
Basophils Relative: 0.8 %
Eosinophils Absolute: 173 {cells}/uL (ref 15–500)
Eosinophils Relative: 3.6 %
HCT: 40.3 % (ref 35.0–45.0)
Hemoglobin: 13.8 g/dL (ref 11.7–15.5)
MCH: 31.4 pg (ref 27.0–33.0)
MCHC: 34.2 g/dL (ref 32.0–36.0)
MCV: 91.8 fL (ref 80.0–100.0)
MPV: 11.2 fL (ref 7.5–12.5)
Monocytes Relative: 7.8 %
Neutro Abs: 2683 {cells}/uL (ref 1500–7800)
Neutrophils Relative %: 55.9 %
Platelets: 223 Thousand/uL (ref 140–400)
RBC: 4.39 Million/uL (ref 3.80–5.10)
RDW: 12.9 % (ref 11.0–15.0)
Total Lymphocyte: 31.9 %
WBC: 4.8 Thousand/uL (ref 3.8–10.8)

## 2024-02-22 LAB — COMPREHENSIVE METABOLIC PANEL WITH GFR
AG Ratio: 2.3 (calc) (ref 1.0–2.5)
ALT: 33 U/L — ABNORMAL HIGH (ref 6–29)
AST: 29 U/L (ref 10–35)
Albumin: 4.6 g/dL (ref 3.6–5.1)
Alkaline phosphatase (APISO): 64 U/L (ref 37–153)
BUN: 17 mg/dL (ref 7–25)
CO2: 30 mmol/L (ref 20–32)
Calcium: 9.3 mg/dL (ref 8.6–10.4)
Chloride: 103 mmol/L (ref 98–110)
Creat: 0.82 mg/dL (ref 0.50–1.03)
Globulin: 2 g/dL (ref 1.9–3.7)
Glucose, Bld: 104 mg/dL — ABNORMAL HIGH (ref 65–99)
Potassium: 4.2 mmol/L (ref 3.5–5.3)
Sodium: 142 mmol/L (ref 135–146)
Total Bilirubin: 0.5 mg/dL (ref 0.2–1.2)
Total Protein: 6.6 g/dL (ref 6.1–8.1)
eGFR: 83 mL/min/1.73m2 (ref >=60)

## 2024-02-22 LAB — TSH: TSH: 1.8 m[IU]/L (ref 0.40–4.50)

## 2024-02-22 LAB — HEMOGLOBIN A1C
Hgb A1c MFr Bld: 5.7 % — ABNORMAL HIGH (ref <5.7)
Mean Plasma Glucose: 117 mg/dL
eAG (mmol/L): 6.5 mmol/L

## 2024-02-26 NOTE — Progress Notes (Signed)
 Annual Comprehensive Physical Exam   Patient Care Team: Perri Ronal PARAS, MD as PCP - General (Internal Medicine) Loni Soyla LABOR, MD as PCP - Cardiology (Cardiology)  Visit Date: 02/28/24   Chief Complaint  Patient presents with   Annual Exam   Subjective:  Patient: [name], Female DOB: 10-17-1965, 58 y.o. MRN: [id number] Vitals:   02/28/24 0954  BP: 110/80   [name] is a 58 y.o. Female who presents today for her Annual Comprehensive Physical Exam. Patient has GE reflux; Hyperlipidemia; Obesity; BMI 37.0-37.9, adult; Cervical disc disease; Family history of coronary artery disease in mother; Essential hypertension; Unstable angina (HCC); Acute chest pain; Snoring; and OSA (obstructive sleep apnea) on their problem list.    She said things are going well.    History of GE reflux treated with OTC omeprazole once daily.  History of Hyperlipidemia treated with rosuvastatin  10 mg daily.  02/21/2024 Lipid panel normal.   History of Hypertension treated with amlodipine  5 mg daily, metoprolol  tartrate 25 mg daily, hydrochlorothiazide  25 mg daily. Blood pressure today is normal at 110/80.    History of Impaired glucose tolerance. 02/21/2024 HgbA1c 5.7% decreased from 5.8% a year ago.    History of moderate obstructive sleep apnea.   status post right second metatarsal osteotomy, right second and third hammertoe repair in 12/22 and is doing well.   Admitted to hospital for unstable angina from 07/20/22 - 07/21/22. EKG without ischemic EKG changes including T wave inversions or ST segment changes. Chest X-ray unremarkable. 07/21/22 coronary CT showed: 1) Coronary calcium  score of 5. 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 BMP and CBC unremarkable.   Going to PT for her neck. Has been seen  at [facility] by Dr. Burnetta. Treated initially with  5 mg Prednisone  dose pack and Gabapentin 300 mg at bedtime. Subsequently prescribed Mobic. She feels she is getting better.  Review of Dr. 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   History of palpitations in July 2019.  At that time blood pressure was elevated.  EKG was normal without ectopy.   History of migraine headaches.  History of cervical cryotherapy as an outpatient in 1991 left lower lobe pneumonia 2004.  Basal cell carcinoma removed from right forehead in 2006.  Benign right breast biopsy and lipoma removed from right shoulder 1993.   Labs 02/21/2024 Blood glucose 104, ALT 33, HgbA1c 5.7, Otherwise WNL.    02/26/2023 Mammogram No mammographic evidence of malignancy. Repeat in one year.    07/21/2022 Coronary Calcium  score: 5   08/03/2023 Colonoscopy the entire examined colon in normal. The distal rectum and anal verge are normal on retroflexion view. Repeat in 10 years.   Vaccine counseling: Influenza vaccine received today, Pneumonia vaccine due, Hepatitis B vaccine discontinued.   Health Maintenance  Topic Date Due   Pneumococcal Vaccine: 50+ Years (1 of 2 - PCV) Never done   Zoster Vaccines- Shingrix (1 of 2) Never done   Mammogram  02/26/2024   Hepatitis B Vaccines 19-59 Average Risk (1 of 3 - 19+ 3-dose series) 02/27/2025 (Originally 05/09/1985)   Cervical Cancer Screening (HPV/Pap Cotest)  02/17/2026   DTaP/Tdap/Td (3 - Td or Tdap) 02/25/2033   Colonoscopy  07/08/2033   Influenza Vaccine  Completed  HPV VACCINES  Aged Out   Meningococcal B Vaccine  Aged Out   COVID-19 Vaccine  Discontinued   Hepatitis C Screening  Discontinued   HIV Screening  Discontinued     Review of Systems  Constitutional:  Negative for fever and malaise/fatigue.  HENT:  Negative for congestion.   Eyes:  Negative for blurred vision.  Respiratory:   Negative for cough and shortness of breath.   Cardiovascular:  Negative for chest pain, palpitations and leg swelling.  Gastrointestinal:  Negative for vomiting.  Musculoskeletal:  Negative for back pain.  Skin:  Negative for rash.  Neurological:  Negative for loss of consciousness and headaches.  All other systems reviewed and are negative.  Objective:  Vitals: body mass index is 38.61 kg/m. Today's Vitals   02/28/24 0954  BP: 110/80  Pulse: 62  SpO2: 97%  Weight: 201 lb (91.2 kg)  Height: 5' 0.5 (1.537 m)   Physical Exam Vitals and nursing note reviewed.  Constitutional:      General: She is not in acute distress.    Appearance: Normal appearance. She is not ill-appearing or toxic-appearing.  HENT:     Head: Normocephalic and atraumatic.     Right Ear: Hearing, tympanic membrane, ear canal and external ear normal.     Left Ear: Hearing, tympanic membrane, ear canal and external ear normal.     Ears:     Comments: TM full bilaterally.     Mouth/Throat:     Pharynx: Oropharynx is clear.  Eyes:     Extraocular Movements: Extraocular movements intact.     Pupils: Pupils are equal, round, and reactive to light.  Neck:     Thyroid : No thyroid  mass, thyromegaly or thyroid  tenderness.     Vascular: No carotid bruit.  Cardiovascular:     Rate and Rhythm: Normal rate and regular rhythm. No extrasystoles are present.    Pulses:          Dorsalis pedis pulses are 2+ on the right side and 2+ on the left side.     Heart sounds: Normal heart sounds. No murmur heard.    No friction rub. No gallop.  Pulmonary:     Effort: Pulmonary effort is normal.     Breath sounds: Normal breath sounds. No decreased breath sounds, wheezing, rhonchi or rales.  Chest:     Chest wall: No mass.  Abdominal:     Palpations: Abdomen is soft. There is no hepatomegaly, splenomegaly or mass.     Tenderness: There is no abdominal tenderness.     Hernia: No hernia is present.  Musculoskeletal:      Cervical back: Normal range of motion.     Right lower leg: No edema.     Left lower leg: No edema.  Lymphadenopathy:     Cervical: No cervical adenopathy.     Upper Body:     Right upper body: No supraclavicular adenopathy.     Left upper body: No supraclavicular adenopathy.  Skin:    General: Skin is warm and dry.  Neurological:     General: No focal deficit present.     Mental Status: She is alert and oriented to person, place, and time. Mental status is at baseline.     Sensory: Sensation is intact.     Motor: Motor function is intact. No weakness.     Deep Tendon Reflexes: Reflexes are normal and symmetric.  Psychiatric:        Attention and Perception: Attention normal.  Mood and Affect: Mood normal.        Speech: Speech normal.        Behavior: Behavior normal.        Thought Content: Thought content normal.        Cognition and Memory: Cognition normal.        Judgment: Judgment normal.     Current Outpatient Medications  Medication Instructions   acetaminophen  (TYLENOL ) 1,000 mg, Every 6 hours PRN   amLODipine  (NORVASC ) 5 MG tablet TAKE 1 TABLET(5 MG) BY MOUTH EVERY MORNING   aspirin  81 mg, As needed   b complex vitamins capsule 1 capsule, As needed   cholecalciferol (VITAMIN D ) 1,000 Units, Daily   hydrochlorothiazide  (HYDRODIURIL ) 25 MG tablet TAKE 1 TABLET(25 MG) BY MOUTH DAILY   ibuprofen (ADVIL) 200 mg, Every 6 hours PRN   loratadine (CLARITIN) 10 mg, Daily   metFORMIN (GLUCOPHAGE) 500 mg, Oral, 2 times daily with meals   metoprolol  tartrate (LOPRESSOR ) 25 MG tablet TAKE 1 TABLET(25 MG) BY MOUTH DAILY   naproxen sodium (ALEVE) 440 mg, 2 times daily PRN   omeprazole (PRILOSEC) 10 mg, Daily   OVER THE COUNTER MEDICATION 1 capsule, Daily   Probiotic Product (PROBIOTIC-10 PO) Take by mouth.   Quercetin 500 mg, Daily   rosuvastatin  (CRESTOR ) 10 MG tablet TAKE 1 TABLET(10 MG) BY MOUTH DAILY   Vitamin C-Vitamin D -Zinc (D3/VITAMIN C/ZINC PO) Daily   Past  Medical History:  Diagnosis Date   Migraine    Medical/Surgical History Narrative:  Allergic/Intolerant to:  Allergies  Allergen Reactions   Cephalexin Nausea And Vomiting   Pantoprazole  Other (See Comments)    Headache    Past Surgical History:  Procedure Laterality Date   FOOT SURGERY Right 04/19/2021   LIPOMA EXCISION     Family History  Problem Relation Age of Onset   Heart disease Mother    Cancer Father    Family History Narrative: Mother has macular degeneration and no longer drives.  Social history: Married with 4 adult children and 2 grandchildren. Non-smoker. Looks after her mother who lives independently.   Most Recent Health Risks Assessment:   Most Recent Social Determinants of Health (Including Hx of Tobacco, Alcohol, and Drug Use) SDOH Screenings   Food Insecurity: No Food Insecurity (07/21/2022)  Housing: Low Risk  (07/21/2022)  Transportation Needs: No Transportation Needs (07/21/2022)  Utilities: Not At Risk (07/21/2022)  Depression (PHQ2-9): Low Risk  (01/01/2024)  Social Connections: Unknown (09/05/2021)   Received from [facility]  Tobacco Use: Low Risk  (02/28/2024)   Social History   Tobacco Use   Smoking status: Never   Smokeless tobacco: Never  Substance Use Topics   Alcohol use: Yes    Comment: rarely   Drug use: No     Most Recent Fall Risk Assessment:    01/04/2024    2:12 PM  Fall Risk   Falls in the past year? 0  Injury with Fall? 0  Follow up Falls evaluation completed   Most Recent Anxiety/Depression Screenings:    01/01/2024   11:42 AM 09/20/2022    9:22 AM  PHQ 2/9 Scores  PHQ - 2 Score 0 0    Results:  Studies Obtained And Personally Reviewed By Me:  02/26/2023 Mammogram No mammographic evidence of malignancy. Repeat in one year.    07/21/2022 Coronary Calcium  score: 5   08/03/2023 Colonoscopy the entire examined colon in normal. The distal rectum and anal verge are normal on retroflexion view. Repeat in 10  years.      Labs:  CBC w/ Differential Lab Results  Component Value Date   WBC 4.8 02/21/2024   RBC 4.39 02/21/2024   HGB 13.8 02/21/2024   HCT 40.3 02/21/2024   PLT 223 02/21/2024   MCV 91.8 02/21/2024   MCH 31.4 02/21/2024   MCHC 34.2 02/21/2024   RDW 12.9 02/21/2024   MPV 11.2 02/21/2024   LYMPHSABS 1,574 02/16/2022   MONOABS 0.5 04/08/2015   BASOSABS 38 02/21/2024    Comprehensive Metabolic Panel Lab Results  Component Value Date   NA 142 02/21/2024   K 4.2 02/21/2024   CL 103 02/21/2024   CO2 30 02/21/2024   GLUCOSE 104 (H) 02/21/2024   BUN 17 02/21/2024   CREATININE 0.82 02/21/2024   CALCIUM  9.3 02/21/2024   PROT 6.6 02/21/2024   ALBUMIN 3.8 07/21/2022   AST 29 02/21/2024   ALT 33 (H) 02/21/2024   ALKPHOS 65 07/21/2022   BILITOT 0.5 02/21/2024   EGFR 83 02/21/2024   GFRNONAA >60 07/21/2022   Lipid Panel  Lab Results  Component Value Date   CHOL 149 02/21/2024   HDL 67 02/21/2024   LDLCALC 65 02/21/2024   TRIG 86 02/21/2024   A1c Lab Results  Component Value Date   HGBA1C 5.7 (H) 02/21/2024    TSH Lab Results  Component Value Date   TSH 1.80 02/21/2024    Assessment & Plan:   Orders Placed This Encounter  Procedures   Flu vaccine trivalent PF, 6mos and older(Flulaval,Afluria,Fluarix,Fluzone)   POCT URINALYSIS DIP (CLINITEK)   Meds ordered this encounter  Medications   metFORMIN (GLUCOPHAGE) 500 MG tablet    Sig: Take 1 tablet (500 mg total) by mouth 2 (two) times daily with a meal.    Dispense:  180 tablet    Refill:  0   GE reflux: treated with OTC omeprazole once daily.  Hyperlipidemia: treated with rosuvastatin  10 mg daily. Lipid panel normal.   Hypertension: treated with amlodipine  5 mg daily, metoprolol  tartrate 25 mg daily, hydrochlorothiazide  25 mg daily. Blood pressure today is normal at 110/80.    Impaired glucose tolerance: 02/21/2024 HgbA1c 5.7% decreased from 5.8% a year ago.  Metformin 500 mg 1 tablet daily for 10 days then 2  tablets daily prescribed.    History of moderate obstructive sleep apnea.   02/26/2023 Mammogram No mammographic evidence of malignancy. Repeat in one year.    07/21/2022 Coronary Calcium  score: 5   08/03/2023 Colonoscopy the entire examined colon in normal. The distal rectum and anal verge are normal on retroflexion view. Repeat in 10 years.   Vaccine counseling: Influenza vaccine received today, Pneumonia vaccine due, Hepatitis B vaccine discontinued.       Annual Comprehensive Physical Exam done today including the all of the following: Reviewed patient's Family Medical History Reviewed patient's SDOH and reviewed tobacco, alcohol, and drug use.  Reviewed and updated list of patient's medical providers Assessment of cognitive impairment was done Assessed patient's functional ability Established a written schedule for health screening services Health Risk Assessent Completed and Reviewed  Discussed health benefits of physical activity, and encouraged her to engage in regular exercise appropriate for her age and condition.    I,Makayla C Reid,acting as a scribe for Ronal JINNY Hailstone, MD.,have documented all relevant documentation on the behalf of Ronal JINNY Hailstone, MD,as directed by  Ronal JINNY Hailstone, MD while in the presence of Ronal JINNY Hailstone, MD.  I, Ronal JINNY Hailstone, MD, have reviewed all documentation for  and agree with the above Annual Wellness Visit documentation.  Ronal JINNY Hailstone, MD Internal Medicine 02/28/2024

## 2024-02-28 ENCOUNTER — Encounter: Payer: Self-pay | Admitting: Internal Medicine

## 2024-02-28 ENCOUNTER — Ambulatory Visit: Payer: Self-pay | Admitting: Internal Medicine

## 2024-02-28 ENCOUNTER — Other Ambulatory Visit: Payer: Self-pay | Admitting: Internal Medicine

## 2024-02-28 VITALS — BP 110/80 | HR 62 | Ht 60.5 in | Wt 201.0 lb

## 2024-02-28 DIAGNOSIS — G4733 Obstructive sleep apnea (adult) (pediatric): Secondary | ICD-10-CM

## 2024-02-28 DIAGNOSIS — E782 Mixed hyperlipidemia: Secondary | ICD-10-CM

## 2024-02-28 DIAGNOSIS — K219 Gastro-esophageal reflux disease without esophagitis: Secondary | ICD-10-CM | POA: Diagnosis not present

## 2024-02-28 DIAGNOSIS — R7302 Impaired glucose tolerance (oral): Secondary | ICD-10-CM | POA: Diagnosis not present

## 2024-02-28 DIAGNOSIS — I10 Essential (primary) hypertension: Secondary | ICD-10-CM

## 2024-02-28 DIAGNOSIS — E7439 Other disorders of intestinal carbohydrate absorption: Secondary | ICD-10-CM

## 2024-02-28 DIAGNOSIS — Z23 Encounter for immunization: Secondary | ICD-10-CM | POA: Diagnosis not present

## 2024-02-28 DIAGNOSIS — M5090 Cervical disc disorder, unspecified, unspecified cervical region: Secondary | ICD-10-CM

## 2024-02-28 DIAGNOSIS — Z0000 Encounter for general adult medical examination without abnormal findings: Secondary | ICD-10-CM | POA: Diagnosis not present

## 2024-02-28 DIAGNOSIS — Z8249 Family history of ischemic heart disease and other diseases of the circulatory system: Secondary | ICD-10-CM

## 2024-02-28 DIAGNOSIS — Z6838 Body mass index (BMI) 38.0-38.9, adult: Secondary | ICD-10-CM

## 2024-02-28 DIAGNOSIS — E785 Hyperlipidemia, unspecified: Secondary | ICD-10-CM

## 2024-02-28 DIAGNOSIS — Z8719 Personal history of other diseases of the digestive system: Secondary | ICD-10-CM

## 2024-02-28 MED ORDER — METFORMIN HCL 500 MG PO TABS: 500.0000 mg | ORAL_TABLET | Freq: Two times a day (BID) | ORAL | 0 refills | Status: DC

## 2024-02-29 ENCOUNTER — Other Ambulatory Visit: Payer: Self-pay | Admitting: Internal Medicine

## 2024-02-29 NOTE — Patient Instructions (Addendum)
 Patient has been placed on twice daily Metformin for impaired glucose tolerance. Follow up in mid December with Hgb A[facility] and OV

## 2024-03-03 LAB — HM MAMMOGRAPHY

## 2024-03-04 ENCOUNTER — Encounter: Payer: Self-pay | Admitting: Internal Medicine

## 2024-03-17 ENCOUNTER — Other Ambulatory Visit: Payer: Self-pay | Admitting: Internal Medicine

## 2024-03-26 ENCOUNTER — Encounter: Payer: Self-pay | Admitting: Internal Medicine

## 2024-03-27 NOTE — Progress Notes (Incomplete)
    Patient Care Team: Perri Ronal PARAS, MD as PCP - General (Internal Medicine) Loni Soyla LABOR, MD as PCP - Cardiology (Cardiology)  Visit Date: 03/27/24  Subjective:    Patient ID: [name] , Female   DOB: 02/16/1966, 58 y.o.    MRN: [id number]   58 y.o. Female presents today for 6 week follow up for Hyperlipidemia, Hypertension, Impaired glucose tolerance. Patient has a past medical history of OSA, GE Reflux.  History of GE reflux treated with OTC omeprazole once daily.   History of Hyperlipidemia treated with rosuvastatin  10 mg daily.  ***.   History of Hypertension treated with amlodipine  5 mg daily, metoprolol  tartrate 25 mg daily, hydrochlorothiazide  25 mg daily. Blood pressure today is normal at ***.    History of Impaired glucose tolerance. ***    History of moderate obstructive sleep apnea. CPAP prescribed by [facility] in July 2024.   Past Medical History:  Diagnosis Date   Migraine      Family History  Problem Relation Age of Onset   Heart disease Mother    Cancer Father     Social History   Social History Narrative   Not on file      ROS      Objective:   Vitals: There were no vitals taken for this visit.   Physical Exam    Results:   Studies obtained and personally reviewed by me:  Imaging, colonoscopy, mammogram, bone density scan, echocardiogram, heart cath, stress test, CT calcium  score, etc. ***   Labs:       Component Value Date/Time   NA 142 02/21/2024 0922   K 4.2 02/21/2024 0922   CL 103 02/21/2024 0922   CO2 30 02/21/2024 0922   GLUCOSE 104 (H) 02/21/2024 0922   BUN 17 02/21/2024 0922   CREATININE 0.82 02/21/2024 0922   CALCIUM  9.3 02/21/2024 0922   PROT 6.6 02/21/2024 0922   ALBUMIN 3.8 07/21/2022 0408   AST 29 02/21/2024 0922   ALT 33 (H) 02/21/2024 0922   ALKPHOS 65 07/21/2022 0408   BILITOT 0.5 02/21/2024 0922   GFRNONAA >60 07/21/2022 0408   GFRNONAA 74 02/09/2020 0913   GFRAA 86 02/09/2020  0913     Lab Results  Component Value Date   WBC 4.8 02/21/2024   HGB 13.8 02/21/2024   HCT 40.3 02/21/2024   MCV 91.8 02/21/2024   PLT 223 02/21/2024    Lab Results  Component Value Date   CHOL 149 02/21/2024   HDL 67 02/21/2024   LDLCALC 65 02/21/2024   TRIG 86 02/21/2024   CHOLHDL 2.2 02/21/2024    Lab Results  Component Value Date   HGBA1C 5.7 (H) 02/21/2024     Lab Results  Component Value Date   TSH 1.80 02/21/2024     No results found for: PSA1, PSA *** delete for female pts  ***    Assessment & Plan:   ***    I,Makayla C Reid,acting as a scribe for Ronal PARAS Perri, MD.,have documented all relevant documentation on the behalf of Ronal PARAS Perri, MD,as directed by  Ronal PARAS Perri, MD while in the presence of Ronal PARAS Perri, MD.   ***

## 2024-04-07 ENCOUNTER — Other Ambulatory Visit

## 2024-04-10 ENCOUNTER — Ambulatory Visit: Admitting: Internal Medicine

## 2024-04-21 NOTE — Progress Notes (Incomplete)
" ° ° °  Patient Care Team: Perri Ronal PARAS, MD as PCP - General (Internal Medicine) Loni Soyla LABOR, MD as PCP - Cardiology (Cardiology)  Visit Date: 04/21/2024  Subjective:    Patient ID: [name] , Female   DOB: 1965/05/03, 58 y.o.    MRN: [id number]   58 y.o. Female presents today for 6 week follow up for Hyperlipidemia, Hypertension, Impaired glucose tolerance. Patient has a past medical history of OSA, GE Reflux.  History of GE reflux treated with OTC omeprazole once daily.   History of Hyperlipidemia treated with rosuvastatin  10 mg daily.  ***.   History of Hypertension treated with amlodipine  5 mg daily, metoprolol  tartrate 25 mg daily, hydrochlorothiazide  25 mg daily. Blood pressure today is normal at ***.    History of Impaired glucose tolerance. ***    History of moderate obstructive sleep apnea. CPAP prescribed by [facility] in July 2024.   Past Medical History:  Diagnosis Date   Migraine      Family History  Problem Relation Age of Onset   Heart disease Mother    Cancer Father     Social History   Social History Narrative   Not on file      ROS      Objective:   Vitals: There were no vitals taken for this visit.   Physical Exam    Results:   Studies obtained and personally reviewed by me:  Imaging, colonoscopy, mammogram, bone density scan, echocardiogram, heart cath, stress test, CT calcium  score, etc. ***   Labs:       Component Value Date/Time   NA 142 02/21/2024 0922   K 4.2 02/21/2024 0922   CL 103 02/21/2024 0922   CO2 30 02/21/2024 0922   GLUCOSE 104 (H) 02/21/2024 0922   BUN 17 02/21/2024 0922   CREATININE 0.82 02/21/2024 0922   CALCIUM  9.3 02/21/2024 0922   PROT 6.6 02/21/2024 0922   ALBUMIN 3.8 07/21/2022 0408   AST 29 02/21/2024 0922   ALT 33 (H) 02/21/2024 0922   ALKPHOS 65 07/21/2022 0408   BILITOT 0.5 02/21/2024 0922   GFRNONAA >60 07/21/2022 0408   GFRNONAA 74 02/09/2020 0913   GFRAA 86 02/09/2020  0913     Lab Results  Component Value Date   WBC 4.8 02/21/2024   HGB 13.8 02/21/2024   HCT 40.3 02/21/2024   MCV 91.8 02/21/2024   PLT 223 02/21/2024    Lab Results  Component Value Date   CHOL 149 02/21/2024   HDL 67 02/21/2024   LDLCALC 65 02/21/2024   TRIG 86 02/21/2024   CHOLHDL 2.2 02/21/2024    Lab Results  Component Value Date   HGBA1C 5.7 (H) 02/21/2024     Lab Results  Component Value Date   TSH 1.80 02/21/2024     No results found for: PSA1, PSA *** delete for female pts  ***    Assessment & Plan:   ***    I,Makayla C Reid,acting as a scribe for Ronal PARAS Perri, MD.,have documented all relevant documentation on the behalf of Ronal PARAS Perri, MD,as directed by  Ronal PARAS Perri, MD while in the presence of Ronal PARAS Perri, MD.   ***   "

## 2024-04-28 ENCOUNTER — Other Ambulatory Visit

## 2024-05-01 ENCOUNTER — Ambulatory Visit: Admitting: Internal Medicine

## 2024-05-15 NOTE — Progress Notes (Incomplete)
" ° ° °  Patient Care Team: Perri Ronal PARAS, MD as PCP - General (Internal Medicine) Loni Soyla LABOR, MD as PCP - Cardiology (Cardiology)  Visit Date: 05/15/24  Subjective:    Patient ID: [name] , Female   DOB: May 31, 1965, 59 y.o.    MRN: [id number]   59 y.o. Female presents today for 6 week follow up for Hyperlipidemia, Hypertension, Impaired glucose tolerance. Patient has a past medical history of OSA, GE Reflux.  History of GE reflux treated with OTC omeprazole once daily.   History of Hyperlipidemia treated with rosuvastatin  10 mg daily.  ***.   History of Hypertension treated with amlodipine  5 mg daily, metoprolol  tartrate 25 mg daily, hydrochlorothiazide  25 mg daily. Blood pressure today is normal at ***.    History of Impaired glucose tolerance. ***    History of moderate obstructive sleep apnea. CPAP prescribed by [facility] in July 2024.   Past Medical History:  Diagnosis Date   Migraine      Family History  Problem Relation Age of Onset   Heart disease Mother    Cancer Father     Social History   Social History Narrative   Not on file      ROS      Objective:   Vitals: There were no vitals taken for this visit.   Physical Exam    Results:   Studies obtained and personally reviewed by me:  Imaging, colonoscopy, mammogram, bone density scan, echocardiogram, heart cath, stress test, CT calcium  score, etc. ***   Labs:       Component Value Date/Time   NA 142 02/21/2024 0922   K 4.2 02/21/2024 0922   CL 103 02/21/2024 0922   CO2 30 02/21/2024 0922   GLUCOSE 104 (H) 02/21/2024 0922   BUN 17 02/21/2024 0922   CREATININE 0.82 02/21/2024 0922   CALCIUM  9.3 02/21/2024 0922   PROT 6.6 02/21/2024 0922   ALBUMIN 3.8 07/21/2022 0408   AST 29 02/21/2024 0922   ALT 33 (H) 02/21/2024 0922   ALKPHOS 65 07/21/2022 0408   BILITOT 0.5 02/21/2024 0922   GFRNONAA >60 07/21/2022 0408   GFRNONAA 74 02/09/2020 0913   GFRAA 86 02/09/2020  0913     Lab Results  Component Value Date   WBC 4.8 02/21/2024   HGB 13.8 02/21/2024   HCT 40.3 02/21/2024   MCV 91.8 02/21/2024   PLT 223 02/21/2024    Lab Results  Component Value Date   CHOL 149 02/21/2024   HDL 67 02/21/2024   LDLCALC 65 02/21/2024   TRIG 86 02/21/2024   CHOLHDL 2.2 02/21/2024    Lab Results  Component Value Date   HGBA1C 5.7 (H) 02/21/2024     Lab Results  Component Value Date   TSH 1.80 02/21/2024     No results found for: PSA1, PSA *** delete for female pts  ***    Assessment & Plan:   ***    I,Makayla C Reid,acting as a scribe for Ronal PARAS Perri, MD.,have documented all relevant documentation on the behalf of Ronal PARAS Perri, MD,as directed by  Ronal PARAS Perri, MD while in the presence of Ronal PARAS Perri, MD.   ***   "

## 2024-05-22 ENCOUNTER — Telehealth: Payer: Self-pay | Admitting: Internal Medicine

## 2024-05-22 NOTE — Telephone Encounter (Signed)
 Done

## 2024-05-26 ENCOUNTER — Other Ambulatory Visit

## 2024-05-26 ENCOUNTER — Other Ambulatory Visit: Payer: Self-pay | Admitting: Internal Medicine

## 2024-05-26 DIAGNOSIS — E7439 Other disorders of intestinal carbohydrate absorption: Secondary | ICD-10-CM

## 2024-05-29 ENCOUNTER — Ambulatory Visit: Admitting: Internal Medicine

## 2024-05-30 ENCOUNTER — Other Ambulatory Visit: Payer: Self-pay | Admitting: Internal Medicine

## 2024-06-17 ENCOUNTER — Other Ambulatory Visit

## 2024-06-19 ENCOUNTER — Ambulatory Visit: Admitting: Internal Medicine
